# Patient Record
Sex: Male | Born: 1955 | Race: White | Hispanic: No | Smoking: Never smoker
Health system: Southern US, Community
[De-identification: ages and names within clinical notes are randomized; demographics above are authoritative.]

## PROBLEM LIST (undated history)

## (undated) DIAGNOSIS — K219 Gastro-esophageal reflux disease without esophagitis: Secondary | ICD-10-CM

## (undated) DIAGNOSIS — R112 Nausea with vomiting, unspecified: Secondary | ICD-10-CM

## (undated) DIAGNOSIS — M1990 Unspecified osteoarthritis, unspecified site: Secondary | ICD-10-CM

## (undated) DIAGNOSIS — C801 Malignant (primary) neoplasm, unspecified: Secondary | ICD-10-CM

## (undated) HISTORY — DX: Gastro-esophageal reflux disease without esophagitis: K21.9

## (undated) HISTORY — PX: OTHER SURGICAL HISTORY: SHX169

## (undated) HISTORY — PX: NO PAST SURGERIES: SHX2092

---

## 2011-05-05 ENCOUNTER — Ambulatory Visit (INDEPENDENT_AMBULATORY_CARE_PROVIDER_SITE_OTHER): Payer: BC Managed Care – PPO | Admitting: Cardiology

## 2011-05-05 ENCOUNTER — Encounter: Payer: Self-pay | Admitting: Cardiology

## 2011-05-05 VITALS — BP 120/70 | HR 65 | Ht 68.0 in | Wt 161.0 lb

## 2011-05-05 DIAGNOSIS — I493 Ventricular premature depolarization: Secondary | ICD-10-CM

## 2011-05-05 DIAGNOSIS — I4949 Other premature depolarization: Secondary | ICD-10-CM

## 2011-05-05 DIAGNOSIS — Z8249 Family history of ischemic heart disease and other diseases of the circulatory system: Secondary | ICD-10-CM

## 2011-05-05 NOTE — Assessment & Plan Note (Signed)
He's not particularly symptomatic with these. I would like to make sure he has a structurally normal heart and so I will order an echocardiogram. He will have exercise treadmill testing as described. I will defer labs (TSH and BMET/Mg) to his primary provider.

## 2011-05-05 NOTE — Patient Instructions (Signed)
The current medical regimen is effective;  continue present plan and medications.  Your physician has requested that you have an exercise tolerance test. For further information please visit [email or web address]. Please also follow instruction sheet, as given.  Your physician has requested that you have an echocardiogram. Echocardiography is a painless test that uses sound waves to create images of your heart. It provides your doctor with information about the size and shape of your heart and how well your heart's chambers and valves are working. This procedure takes approximately one hour. There are no restrictions for this procedure.   

## 2011-05-05 NOTE — Progress Notes (Signed)
   HPI The patient has no past cardiac history. He was noted to have bradycardia recently. He wasn't noticing this. 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 He does not have any presyncope or syncope. He doesn't notice any palpitations. He has no chest pressure, neck or arm discomfort. He denies any shortness of breath, PND or orthopnea. Of note he doesn't exercise but he does vigorous work at times without limitations.   No Known Allergies  Current Outpatient Prescriptions  Medication Sig Dispense Refill  . RABEprazole (ACIPHEX) 20 MG tablet Take 20 mg by mouth daily.        Past Medical History  Diagnosis Date  . GERD (gastroesophageal reflux disease)     Past Surgical History  Procedure Date  . None     Family History  Problem Relation Age of Onset  . Coronary artery disease Father 93    Died of MI  . Coronary artery disease Paternal Uncle 78    Died of MI (?)    History   Social History  . Marital Status: Widower    Spouse Name: N/A    Number of Children: 0  . Years of Education: N/A   Occupational History  . Truck driver    Social History Main Topics  . Smoking status: Never Smoker   . Smokeless tobacco: Not on file  . Alcohol Use: Not on file  . Drug Use: Not on file  . Sexually Active: Not on file   Other Topics Concern  . Not on file   Social History Narrative   Lives alone. Wife was disabled.    ROS:  As stated in the HPI and negative for all other systems.  PHYSICAL EXAM BP 120/70  Pulse 65  Ht 5\' 8"  (1.727 m)  Wt 161 lb (73.029 kg)  BMI 24.48 kg/m2 GENERAL:  Well appearing HEENT:  Pupils equal round and reactive, fundi not visualized, oral mucosa unremarkable NECK:  No jugular venous distention, waveform within normal limits, carotid upstroke brisk and symmetric, no bruits, no thyromegaly LYMPHATICS:  No cervical, inguinal adenopathy LUNGS:  Clear to auscultation bilaterally BACK:  No CVA  tenderness CHEST:  Unremarkable HEART:  PMI not displaced or sustained,S1 and S2 within normal limits, no S3, no S4, no clicks, no rubs, no murmurs ABD:  Flat, positive bowel sounds normal in frequency in pitch, no bruits, no rebound, no guarding, no midline pulsatile mass, no hepatomegaly, no splenomegaly EXT:  2 plus pulses throughout, no edema, no cyanosis no clubbing SKIN:  No rashes no nodules NEURO:  Cranial nerves II through XII grossly intact, motor grossly intact throughout [facility]:  Cognitively intact, oriented to person place and time  EKG:  Sinus rhythm, rate 65, axis within normal limits, intervals within normal limits, premature ventricular contractions, no acute ST-T wave changes 05/05/2011  ASSESSMENT AND PLAN

## 2011-05-05 NOTE — Assessment & Plan Note (Signed)
I will bring the patient back for a POET (Plain Old Exercise Test). This will allow me to screen for obstructive coronary disease, risk stratify and very importantly provide a prescription for exercise.   

## 2011-05-19 ENCOUNTER — Ambulatory Visit: Payer: Self-pay | Admitting: Cardiology

## 2011-05-20 ENCOUNTER — Ambulatory Visit (HOSPITAL_COMMUNITY): Payer: BC Managed Care – PPO | Attending: Cardiology

## 2011-05-20 ENCOUNTER — Ambulatory Visit (INDEPENDENT_AMBULATORY_CARE_PROVIDER_SITE_OTHER): Payer: BC Managed Care – PPO | Admitting: Nurse Practitioner

## 2011-05-20 ENCOUNTER — Other Ambulatory Visit: Payer: Self-pay

## 2011-05-20 ENCOUNTER — Encounter: Payer: Self-pay | Admitting: Nurse Practitioner

## 2011-05-20 DIAGNOSIS — Z8249 Family history of ischemic heart disease and other diseases of the circulatory system: Secondary | ICD-10-CM

## 2011-05-20 DIAGNOSIS — I4949 Other premature depolarization: Secondary | ICD-10-CM

## 2011-05-20 DIAGNOSIS — I493 Ventricular premature depolarization: Secondary | ICD-10-CM

## 2011-05-20 NOTE — Procedures (Signed)
Exercise Treadmill Test  Pre-Exercise Testing Evaluation Rhythm: sinus bradycardia  Rate: 56   PR:  .18 QRS:  .12  QT:  .40 QTc: .38     Test  Exercise Tolerance Test Ordering MD: Angelina Sheriff, MD  Interpreting MD: Ward Givens NP  Unique Test No: 1  Treadmill:  1  Indication for ETT: Palpitation  Contraindication to ETT: No   Stress Modality: exercise - treadmill  Cardiac Imaging Performed: non   Protocol: standard Bruce - maximal  Max BP:  167/85  Max MPHR (bpm):  162 85% MPR (bpm):  138  MPHR obtained (bpm):  142 % MPHR obtained:  88%  Reached 85% MPHR (min:sec):  8:14 Total Exercise Time (min-sec):  9:00  Workload in METS:  10.1 Borg Scale: 16  Reason ETT Terminated:  desired heart rate attained    ST Segment Analysis At Rest: normal ST segments - no evidence of significant ST depression With Exercise: no evidence of significant ST depression  Other Information Arrhythmia:  rare pvc's - asymptomatic. Angina during ETT:  absent (0) Quality of ETT:  diagnostic  ETT Interpretation:  normal - no evidence of ischemia by ST analysis  Comments: Good exercise tolerance.  No chest pain.  No acute st/t changes  Recommendations: F/u Dr. Antoine Poche

## 2011-05-28 ENCOUNTER — Telehealth: Payer: Self-pay | Admitting: Cardiology

## 2011-05-28 NOTE — Telephone Encounter (Signed)
F/u   Patient returning nurse call concerning test results, he can be reached at [phone].

## 2011-05-28 NOTE — Telephone Encounter (Signed)
Pt aware of echo results. He will call [place] office Monday to schedule appt with Dr. Antoine Poche in 2 months. Mylo Red RN

## 2011-05-28 NOTE — Telephone Encounter (Signed)
Fu msg Pt was calling back about test results

## 2011-05-31 NOTE — Telephone Encounter (Signed)
Will have schedulers to call to schedule pt in 2 months for follow up

## 2011-06-04 ENCOUNTER — Telehealth: Payer: Self-pay | Admitting: Cardiology

## 2011-06-04 NOTE — Telephone Encounter (Signed)
New problem:  Message sent to [facility] Dr. Antoine Poche scheduler from nurse Avie Arenas to contact patient for an appointment with Dr. Antoine Poche in [place].  Evan Baker has call patient several time since 05-31-2011. To make an appointment.

## 2011-06-04 NOTE — Telephone Encounter (Signed)
Pt was instructed to call to schedule an appt in [place]

## 2011-08-18 ENCOUNTER — Ambulatory Visit (INDEPENDENT_AMBULATORY_CARE_PROVIDER_SITE_OTHER): Payer: BC Managed Care – PPO | Admitting: Cardiology

## 2011-08-18 ENCOUNTER — Encounter: Payer: Self-pay | Admitting: Cardiology

## 2011-08-18 VITALS — BP 102/70 | HR 67 | Ht 68.0 in | Wt 161.0 lb

## 2011-08-18 DIAGNOSIS — I4949 Other premature depolarization: Secondary | ICD-10-CM

## 2011-08-18 DIAGNOSIS — I493 Ventricular premature depolarization: Secondary | ICD-10-CM

## 2011-08-18 DIAGNOSIS — Z8249 Family history of ischemic heart disease and other diseases of the circulatory system: Secondary | ICD-10-CM

## 2011-08-18 NOTE — Progress Notes (Signed)
   HPI The patient presents for followup of PVCs and a mildly reduced ejection fraction. He did have an echocardiogram to evaluate the PVCs. This demonstrated his EF was slightly low at 45% with global hypokinesis. There were no significant valvular abnormalities.  He had an exercise tolerance test that did not suggest any ischemia. He had rare PVCs.  Since I last saw him he has done well.  The patient denies any new symptoms such as chest discomfort, neck or arm discomfort. There has been no new shortness of breath, PND or orthopnea. There have been no reported palpitations, presyncope or syncope.  He is very physically active without bringing on any symptoms. He did not notice any of his PVCs.  No Known Allergies  Current Outpatient Prescriptions  Medication Sig Dispense Refill  . RABEprazole (ACIPHEX) 20 MG tablet Take 20 mg by mouth daily.        Past Medical History  Diagnosis Date  . GERD (gastroesophageal reflux disease)     Past Surgical History  Procedure Date  . None     ROS:  As stated in the HPI and negative for all other systems.  PHYSICAL EXAM BP 102/70  Pulse 67  Ht 5\' 8"  (1.727 m)  Wt 161 lb (73.029 kg)  BMI 24.48 kg/m2 GENERAL:  Well appearing NECK:  No jugular venous distention, waveform within normal limits, carotid upstroke brisk and symmetric, no bruits, no thyromegaly LUNGS:  Clear to auscultation bilaterally BACK:  No CVA tenderness CHEST:  Unremarkable HEART:  PMI not displaced or sustained,S1 and S2 within normal limits, no S3, no S4, no clicks, no rubs, no murmurs ABD:  Flat, positive bowel sounds normal in frequency in pitch, no bruits, no rebound, no guarding, no midline pulsatile mass, no hepatomegaly, no splenomegaly EXT:  2 plus pulses throughout, no edema, no cyanosis no clubbing   EKG:  Sinus rhythm, rate 67, possible right atrial enlargement, premature ventricular contractions, no acute ST-T wave changes. 08/18/2011  ASSESSMENT AND  PLAN   PVC (premature ventricular contraction) -   He is having no symptoms related to this. I will check TSH basic metabolic profile and magnesium. However, otherwise no change in therapy or evaluation is indicated.   Abnormal echocardiogram -  Does have a mildly reduced ejection fraction. 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  We discussed this and I plan an echocardiogram in one year. No change in therapy is indicated.  Preop -  The patient informs me that he will be having a back surgery. If this happens within this year I would see no contraindication from a cardiovascular standpoint. He did have a negative exercise treadmill test with no evidence of ischemia and no symptoms.

## 2011-08-18 NOTE — Patient Instructions (Addendum)
The current medical regimen is effective;  continue present plan and medications.  Please have blood work (BMP, TSH and MG) today.  Your physician has requested that you have an echocardiogram in 1 year prior to your follow up a. Echocardiography is a painless test that uses sound waves to create images of your heart. It provides your doctor with information about the size and shape of your heart and how well your heart's chambers and valves are working. This procedure takes approximately one hour. There are no restrictions for this procedure.  Follow up in 1 year with Dr Antoine Poche.  You will receive a letter in the mail 2 months before you are due.  Please call us when you receive this letter to schedule your follow up appointment.

## 2012-01-03 ENCOUNTER — Encounter: Payer: Self-pay | Admitting: Internal Medicine

## 2012-02-10 ENCOUNTER — Encounter: Payer: BC Managed Care – PPO | Admitting: Internal Medicine

## 2012-03-20 ENCOUNTER — Telehealth: Payer: Self-pay | Admitting: Cardiology

## 2012-03-20 NOTE — Telephone Encounter (Signed)
Will forward to MD for review and clearance

## 2012-03-20 NOTE — Telephone Encounter (Signed)
New Problem:     Called in wanting a surgical clearance notice for a patient.  Please call back after 2:30-3:00pm.

## 2012-03-25 NOTE — Telephone Encounter (Signed)
The patient would be at acceptable risk for surgery.  I do not know the name of the surgeon.

## 2012-03-28 NOTE — Telephone Encounter (Signed)
Clearance will be faxed to [phone] per [facility].

## 2012-04-07 ENCOUNTER — Encounter: Payer: Self-pay | Admitting: Nurse Practitioner

## 2012-04-07 ENCOUNTER — Ambulatory Visit (INDEPENDENT_AMBULATORY_CARE_PROVIDER_SITE_OTHER): Payer: BC Managed Care – PPO | Admitting: Nurse Practitioner

## 2012-04-07 VITALS — BP 117/82 | HR 59 | Temp 98.4°F | Ht 68.0 in | Wt 163.0 lb

## 2012-04-07 DIAGNOSIS — K219 Gastro-esophageal reflux disease without esophagitis: Secondary | ICD-10-CM | POA: Insufficient documentation

## 2012-04-07 DIAGNOSIS — Z008 Encounter for other general examination: Secondary | ICD-10-CM

## 2012-04-07 NOTE — Patient Instructions (Signed)

## 2012-04-07 NOTE — Progress Notes (Signed)
  Subjective:    Patient ID: Evan Baker, male    DOB: [birth year], 57 y.o.   MRN: [id number]  HPIPatinet here today for surgical clearance. He is having back surgery April 11,2014 by Dr. Alycia Patten.    Review of Systems  Constitutional: Negative.   HENT: Negative.   Eyes: Negative.   Respiratory: Negative.   Cardiovascular: Negative.   Gastrointestinal: Negative.   Endocrine: Negative.   Genitourinary: Negative.   Musculoskeletal: Positive for back pain (radiating down left leg).  Psychiatric/Behavioral: Negative.        Objective:   Physical Exam  Constitutional: He is oriented to person, place, and time. He appears well-developed and well-nourished.  HENT:  Head: Normocephalic.  Eyes: Pupils are equal, round, and reactive to light.  Neck: Normal range of motion.  Cardiovascular: Normal rate, regular rhythm, normal heart sounds and intact distal pulses.   No murmur heard. Pulmonary/Chest: Effort normal and breath sounds normal. He exhibits no tenderness.  Abdominal: Soft. There is no tenderness.  Musculoskeletal: Normal range of motion.  Neurological: He is alert and oriented to person, place, and time.  Skin: Skin is warm and dry.   BP 117/82  Pulse 59  Temp(Src) 98.4 F (36.9 C) (Oral)  Ht 5\' 8"  (1.727 m)  Wt 163 lb (73.936 kg)  BMI 24.79 kg/m2        Assessment & Plan:  Surgical Clearance  Mary-Margaret Daphine Deutscher, FNP

## 2012-04-13 ENCOUNTER — Telehealth: Payer: Self-pay | Admitting: Cardiology

## 2012-04-13 NOTE — Telephone Encounter (Signed)
Received EKG - given to Dr Antoine Poche for review

## 2012-04-13 NOTE — Telephone Encounter (Signed)
New problem    Pre-op EKG will be faxed over today . Surgery on  4/11. Back surgery   Please compare. PCP does not feel comfortable in addressing  the EKG .

## 2012-04-14 NOTE — Telephone Encounter (Signed)
Left message for Marcelino Duster - per Dr Antoine Poche pt is OK for surgery as cleared.  Requested she call back with any questions.

## 2012-04-14 NOTE — Telephone Encounter (Signed)
Spoke with Marcelino Duster - aware Dr Antoine Poche gave clearance for surgery and it was faxed to their office previously.  She is concerned about the EKG that was faxed yesterday as it demonstrates a "new finding of right BBB"  Marcelino Duster aware I will discuss with MD this afternoon and call her back

## 2012-04-14 NOTE — Telephone Encounter (Signed)
Follow Up    Following up on surgical clearance and EKG that was faxed. Would like to speak to nurse.

## 2012-05-11 HISTORY — PX: SPINE SURGERY: SHX786

## 2012-05-12 ENCOUNTER — Other Ambulatory Visit: Payer: Self-pay

## 2012-05-12 MED ORDER — RABEPRAZOLE SODIUM 20 MG PO TBEC: 20.0000 mg | DELAYED_RELEASE_TABLET | Freq: Every day | ORAL | Status: DC

## 2012-05-12 NOTE — Telephone Encounter (Signed)
Last seen 12/24/11  Print and have nurse call patient to pick up for mail order

## 2012-05-12 NOTE — Telephone Encounter (Signed)
rx ready for pickup 

## 2012-05-13 NOTE — Telephone Encounter (Signed)
Called in.

## 2012-05-15 ENCOUNTER — Telehealth: Payer: Self-pay | Admitting: Nurse Practitioner

## 2012-05-16 ENCOUNTER — Telehealth: Payer: Self-pay | Admitting: *Deleted

## 2012-05-16 NOTE — Telephone Encounter (Signed)
Left message to c/b.

## 2012-05-16 NOTE — Telephone Encounter (Signed)
[facility] called concerned that pts profile listed that he was allergic to PPIs and he had a rx there waiting to be picked up for aciphex. Advised that pt said he had been taking aciphex for several years. Pharmacist said that they would fix profile. Pt notified

## 2012-08-08 ENCOUNTER — Encounter: Payer: Self-pay | Admitting: Family Medicine

## 2012-08-08 ENCOUNTER — Ambulatory Visit (INDEPENDENT_AMBULATORY_CARE_PROVIDER_SITE_OTHER): Payer: BC Managed Care – PPO | Admitting: Family Medicine

## 2012-08-08 VITALS — BP 119/77 | HR 59 | Temp 98.5°F | Ht 68.0 in | Wt 157.0 lb

## 2012-08-08 DIAGNOSIS — K219 Gastro-esophageal reflux disease without esophagitis: Secondary | ICD-10-CM

## 2012-08-08 MED ORDER — RABEPRAZOLE SODIUM 20 MG PO TBEC: 20.0000 mg | DELAYED_RELEASE_TABLET | Freq: Every day | ORAL | Status: DC

## 2012-08-08 NOTE — Progress Notes (Signed)
  Subjective:    Patient ID: Evan Baker, male    DOB: 1955/07/03, 57 y.o.   MRN: [id number]  HPI Pt is here for PPI refill  Has been on aciphex for several years  Asymptomatic with treatment  No hoarseness, cough, epigastric pain  Has tried multiple other PPIs. This has been only effective medication.  Would like refill.  Is not eating any trigger foods like fried goods, tomatoes.    Review of Systems  All other systems reviewed and are negative.       Objective:   Physical Exam  Constitutional: He appears well-developed and well-nourished.  HENT:  Head: Normocephalic and atraumatic.  Eyes: Conjunctivae are normal. Pupils are equal, round, and reactive to light.  Neck: Normal range of motion.  Cardiovascular: Normal rate and regular rhythm.   Pulmonary/Chest: Effort normal.  Abdominal: Soft.  Musculoskeletal: Normal range of motion.  Neurological: He is alert.  Skin: Skin is warm.          Assessment & Plan:  GERD (gastroesophageal reflux disease) - Plan: RABEprazole (ACIPHEX) 20 MG tablet, Comprehensive metabolic panel, Magnesium  Aciphex refilled.  Will also check CMET and mag level given increased risk of electrolyte derangement with long term use.  Continue lifestyle modification.  Follow up as needed.

## 2012-08-09 LAB — MAGNESIUM: Magnesium: 1.9 mg/dL (ref 1.6–2.6)

## 2012-08-09 LAB — COMPREHENSIVE METABOLIC PANEL
ALT: 8 IU/L (ref 0–44)
Albumin/Globulin Ratio: 2.1 (ref 1.1–2.5)
Albumin: 4.2 g/dL (ref 3.5–5.5)
BUN: 10 mg/dL (ref 6–24)
Calcium: 9.5 mg/dL (ref 8.7–10.2)
GFR calc Af Amer: 88 mL/min/{1.73_m2} (ref >59)
GFR calc non Af Amer: 76 mL/min/{1.73_m2} (ref >59)
Glucose: 74 mg/dL (ref 65–99)
Potassium: 4 mmol/L (ref 3.5–5.2)
Total Bilirubin: 0.6 mg/dL (ref 0.0–1.2)
Total Protein: 6.2 g/dL (ref 6.0–8.5)

## 2012-10-19 ENCOUNTER — Other Ambulatory Visit: Payer: Self-pay | Admitting: Family Medicine

## 2012-10-20 NOTE — Telephone Encounter (Signed)
Patient last seen in office on 08-08-12 by [facility]. Pharmacy reqesting but do not see in chart on med list. Please advise. Also it is for mail order.

## 2012-10-31 ENCOUNTER — Ambulatory Visit (INDEPENDENT_AMBULATORY_CARE_PROVIDER_SITE_OTHER): Payer: BC Managed Care – PPO | Admitting: General Practice

## 2012-10-31 ENCOUNTER — Encounter (INDEPENDENT_AMBULATORY_CARE_PROVIDER_SITE_OTHER): Payer: Self-pay

## 2012-10-31 ENCOUNTER — Encounter: Payer: Self-pay | Admitting: General Practice

## 2012-10-31 ENCOUNTER — Telehealth: Payer: Self-pay | Admitting: Nurse Practitioner

## 2012-10-31 VITALS — BP 122/81 | HR 63 | Temp 97.1°F | Ht 68.0 in | Wt 157.0 lb

## 2012-10-31 DIAGNOSIS — F32A Depression, unspecified: Secondary | ICD-10-CM

## 2012-10-31 DIAGNOSIS — F329 Major depressive disorder, single episode, unspecified: Secondary | ICD-10-CM

## 2012-10-31 MED ORDER — PAROXETINE HCL 20 MG PO TABS: 20.0000 mg | ORAL_TABLET | ORAL | Status: DC

## 2012-10-31 NOTE — Progress Notes (Signed)
  Subjective:    Patient ID: Evan Baker, male    DOB: 1955/10/09, 57 y.o.   MRN: [id number]  HPI Patient presents today with complaints of mood swings, unable to complete task and lack of motivation. Reports onset as one year ago and gradually worsening. Reports onset He reports a history of depression and feeling this way in the past. Reports taking paxil six years ago and found it to be very effective. Reports weaning himself off the paxil. Denies having side effects from paxil in the past. Denies thoughts of harming self or others.     Review of Systems  Constitutional: Negative for fever and chills.  Respiratory: Negative for chest tightness and shortness of breath.   Cardiovascular: Negative for chest pain and palpitations.  Gastrointestinal: Negative for nausea, vomiting, abdominal pain, diarrhea, constipation and blood in stool.  Genitourinary: Negative for difficulty urinating.  Neurological: Negative for dizziness, weakness and headaches.  Psychiatric/Behavioral: Negative for suicidal ideas and sleep disturbance. The patient is not nervous/anxious.        Objective:   Physical Exam  Constitutional: He is oriented to person, place, and time. He appears well-developed and well-nourished.  HENT:  Head: Normocephalic and atraumatic.  Right Ear: External ear normal.  Left Ear: External ear normal.  Nose: Nose normal.  Mouth/Throat: Oropharynx is clear and moist.  Eyes: Conjunctivae and EOM are normal. Pupils are equal, round, and reactive to light.  Neck: Normal range of motion. Neck supple. No thyromegaly present.  Cardiovascular: Normal rate, regular rhythm and normal heart sounds.   Pulmonary/Chest: Effort normal and breath sounds normal. No respiratory distress. He exhibits no tenderness.  Abdominal: Soft. Bowel sounds are normal. He exhibits no distension. There is no tenderness.  Musculoskeletal: Normal range of motion.  Lymphadenopathy:    He has no cervical  adenopathy.  Neurological: He is alert and oriented to person, place, and time.  Skin: Skin is warm and dry.  Psychiatric: He has a normal mood and affect.          Assessment & Plan:  1. Depression  - PARoxetine (PAXIL) 20 MG tablet; Take 1 tablet (20 mg total) by mouth every morning.  Dispense: 30 tablet; Refill: 0 -discussed and provided information on depression -RTO for follow up in 1 week -Patient verbalized understanding Coralie Keens, FNP-C

## 2012-10-31 NOTE — Telephone Encounter (Signed)
Pt needs appt to restart Paxil appt scheduled

## 2012-10-31 NOTE — Patient Instructions (Signed)

## 2012-11-06 ENCOUNTER — Encounter: Payer: Self-pay | Admitting: General Practice

## 2012-11-06 ENCOUNTER — Ambulatory Visit (INDEPENDENT_AMBULATORY_CARE_PROVIDER_SITE_OTHER): Payer: BC Managed Care – PPO | Admitting: General Practice

## 2012-11-06 VITALS — BP 118/77 | HR 53 | Temp 96.7°F | Ht 68.0 in | Wt 155.0 lb

## 2012-11-06 DIAGNOSIS — F329 Major depressive disorder, single episode, unspecified: Secondary | ICD-10-CM

## 2012-11-06 DIAGNOSIS — F32A Depression, unspecified: Secondary | ICD-10-CM

## 2012-11-06 MED ORDER — PAROXETINE HCL 20 MG PO TABS: 20.0000 mg | ORAL_TABLET | ORAL | Status: DC

## 2012-11-06 NOTE — Progress Notes (Signed)
  Subjective:    Patient ID: Evan Baker, male    DOB: Jun 29, 1955, 57 y.o.   MRN: [id number]  HPI Patient presents today for follow up of depression. He was started on paxil at last visit and reports moods swings, motivation, and completing task have improved. Reports feeling the medication and dosage is very effective. Reports taking medications as directed. Denies side effects. Denies thoughts of harming self or others.     Review of Systems  Constitutional: Negative for fever and chills.  Respiratory: Negative for chest tightness and shortness of breath.   Cardiovascular: Negative for chest pain and palpitations.  Gastrointestinal: Negative for nausea, vomiting, abdominal pain, diarrhea, constipation and blood in stool.  Genitourinary: Negative for difficulty urinating.  Neurological: Negative for dizziness, weakness and headaches.  Psychiatric/Behavioral: Negative for suicidal ideas and sleep disturbance. The patient is not nervous/anxious.        Objective:   Physical Exam  Constitutional: He is oriented to person, place, and time. He appears well-developed and well-nourished.  HENT:  Head: Normocephalic and atraumatic.  Right Ear: External ear normal.  Left Ear: External ear normal.  Nose: Nose normal.  Mouth/Throat: Oropharynx is clear and moist.  Eyes: Conjunctivae and EOM are normal. Pupils are equal, round, and reactive to light.  Neck: Normal range of motion. Neck supple. No thyromegaly present.  Cardiovascular: Normal rate, regular rhythm and normal heart sounds.   Pulmonary/Chest: Effort normal and breath sounds normal. No respiratory distress. He exhibits no tenderness.  Abdominal: Soft. Bowel sounds are normal. He exhibits no distension. There is no tenderness.  Musculoskeletal: Normal range of motion.  Lymphadenopathy:    He has no cervical adenopathy.  Neurological: He is alert and oriented to person, place, and time.  Skin: Skin is warm and dry.   Psychiatric: He has a normal mood and affect.          Assessment & Plan:  1. Depression - PARoxetine (PAXIL) 20 MG tablet; Take 1 tablet (20 mg total) by mouth every morning.  Dispense: 30 tablet; Refill: 3 -RTO in 3 months and prn -Patient verbalized understanding -Coralie Keens, FNP-C

## 2012-12-06 ENCOUNTER — Ambulatory Visit (INDEPENDENT_AMBULATORY_CARE_PROVIDER_SITE_OTHER): Payer: BC Managed Care – PPO | Admitting: General Practice

## 2012-12-06 ENCOUNTER — Encounter: Payer: Self-pay | Admitting: General Practice

## 2012-12-06 VITALS — BP 114/69 | HR 63 | Temp 96.5°F | Ht 68.0 in | Wt 157.0 lb

## 2012-12-06 DIAGNOSIS — F32A Depression, unspecified: Secondary | ICD-10-CM

## 2012-12-06 DIAGNOSIS — F329 Major depressive disorder, single episode, unspecified: Secondary | ICD-10-CM

## 2012-12-06 MED ORDER — PAROXETINE HCL 20 MG PO TABS: 20.0000 mg | ORAL_TABLET | ORAL | Status: DC

## 2012-12-06 NOTE — Progress Notes (Signed)
  Subjective:    Patient ID: Evan Baker, male    DOB: 1955/09/23, 57 y.o.   MRN: [id number]  HPI Patient presents today for follow up of depression. He reports medication is effective in controlling mood swings, increasing motivation, and able to complete task. Reports taking medications as directed. Denies side effects. Denies thoughts of harming self or others.     Review of Systems  Constitutional: Negative for fever and chills.  Respiratory: Negative for chest tightness and shortness of breath.   Cardiovascular: Negative for chest pain and palpitations.  Gastrointestinal: Negative for nausea, vomiting, abdominal pain, diarrhea, constipation and blood in stool.  Genitourinary: Negative for difficulty urinating.  Neurological: Negative for dizziness, weakness and headaches.  Psychiatric/Behavioral: Negative for suicidal ideas and sleep disturbance. The patient is not nervous/anxious.        Objective:   Physical Exam  Constitutional: He is oriented to person, place, and time. He appears well-developed and well-nourished.  HENT:  Head: Normocephalic and atraumatic.  Right Ear: External ear normal.  Left Ear: External ear normal.  Nose: Nose normal.  Mouth/Throat: Oropharynx is clear and moist.  Eyes: Conjunctivae and EOM are normal. Pupils are equal, round, and reactive to light.  Neck: Normal range of motion. Neck supple. No thyromegaly present.  Cardiovascular: Normal rate, regular rhythm and normal heart sounds.   Pulmonary/Chest: Effort normal and breath sounds normal. No respiratory distress. He exhibits no tenderness.  Abdominal: Soft. Bowel sounds are normal. He exhibits no distension. There is no tenderness.  Musculoskeletal: Normal range of motion.  Lymphadenopathy:    He has no cervical adenopathy.  Neurological: He is alert and oriented to person, place, and time.  Skin: Skin is warm and dry.  Psychiatric: He has a normal mood and affect.           Assessment & Plan:  1. Depression  - PARoxetine (PAXIL) 20 MG tablet; Take 1 tablet (20 mg total) by mouth every morning.  Dispense: 30 tablet; Refill: 6 -continue current medication -RTO in 6 months for follow up -Patient verbalized understanding Coralie Keens, FNP-C

## 2012-12-06 NOTE — Patient Instructions (Signed)
Exercise to Stay Healthy Exercise helps you become and stay healthy. EXERCISE IDEAS AND TIPS Choose exercises that:  You enjoy.  Fit into your day. You do not need to exercise really hard to be healthy. You can do exercises at a slow or medium level and stay healthy. You can:  Stretch before and after working out.  Try yoga, Pilates, or tai chi.  Lift weights.  Walk fast, swim, jog, run, climb stairs, bicycle, dance, or rollerskate.  Take aerobic classes. Exercises that burn about 150 calories:  Running 1  miles in 15 minutes.  Playing volleyball for 45 to 60 minutes.  Washing and waxing a car for 45 to 60 minutes.  Playing touch football for 45 minutes.  Walking 1  miles in 35 minutes.  Pushing a stroller 1  miles in 30 minutes.  Playing basketball for 30 minutes.  Raking leaves for 30 minutes.  Bicycling 5 miles in 30 minutes.  Walking 2 miles in 30 minutes.  Dancing for 30 minutes.  Shoveling snow for 15 minutes.  Swimming laps for 20 minutes.  Walking up stairs for 15 minutes.  Bicycling 4 miles in 15 minutes.  Gardening for 30 to 45 minutes.  Jumping rope for 15 minutes.  Washing windows or floors for 45 to 60 minutes. Document Released: 01/30/2010 Document Revised: 03/22/2011 Document Reviewed: 01/30/2010 [employer] Patient Information 2014 [employer].  

## 2013-01-02 ENCOUNTER — Ambulatory Visit (INDEPENDENT_AMBULATORY_CARE_PROVIDER_SITE_OTHER): Payer: BC Managed Care – PPO | Admitting: Family Medicine

## 2013-01-02 ENCOUNTER — Encounter: Payer: Self-pay | Admitting: Family Medicine

## 2013-01-02 ENCOUNTER — Telehealth: Payer: Self-pay | Admitting: General Practice

## 2013-01-02 VITALS — BP 111/74 | HR 58 | Temp 97.1°F | Ht 68.0 in | Wt 155.8 lb

## 2013-01-02 DIAGNOSIS — Z8249 Family history of ischemic heart disease and other diseases of the circulatory system: Secondary | ICD-10-CM

## 2013-01-02 DIAGNOSIS — R52 Pain, unspecified: Secondary | ICD-10-CM | POA: Insufficient documentation

## 2013-01-02 DIAGNOSIS — I4949 Other premature depolarization: Secondary | ICD-10-CM

## 2013-01-02 DIAGNOSIS — I493 Ventricular premature depolarization: Secondary | ICD-10-CM

## 2013-01-02 DIAGNOSIS — R350 Frequency of micturition: Secondary | ICD-10-CM | POA: Insufficient documentation

## 2013-01-02 DIAGNOSIS — K219 Gastro-esophageal reflux disease without esophagitis: Secondary | ICD-10-CM

## 2013-01-02 DIAGNOSIS — R42 Dizziness and giddiness: Secondary | ICD-10-CM | POA: Insufficient documentation

## 2013-01-02 LAB — POCT CBC
Granulocyte percent: 66.4 %G (ref 37–80)
HCT, POC: 49.4 % (ref 43.5–53.7)
Hemoglobin: 15.8 g/dL (ref 14.1–18.1)
Lymph, poc: 2.1 (ref 0.6–3.4)
MCH, POC: 28.2 pg (ref 27–31.2)
MCHC: 32 g/dL (ref 31.8–35.4)
MCV: 88.3 fL (ref 80–97)
MPV: 8.3 fL (ref 0–99.8)
POC Granulocyte: 5.2 (ref 2–6.9)
POC LYMPH PERCENT: 36.7 %L (ref 10–50)
Platelet Count, POC: 279 10*3/uL (ref 142–424)
RBC: 5.6 M/uL (ref 4.69–6.13)
RDW, POC: 13.4 %
WBC: 7.9 10*3/uL (ref 4.6–10.2)

## 2013-01-02 LAB — POCT URINALYSIS DIPSTICK
Bilirubin, UA: NEGATIVE
Blood, UA: NEGATIVE
Glucose, UA: NEGATIVE
Ketones, UA: NEGATIVE
Leukocytes, UA: NEGATIVE
Nitrite, UA: NEGATIVE
Protein, UA: NEGATIVE
Spec Grav, UA: 1.02
Urobilinogen, UA: NEGATIVE
pH, UA: 6

## 2013-01-02 LAB — POCT UA - MICROSCOPIC ONLY
Bacteria, U Microscopic: NEGATIVE
Casts, Ur, LPF, POC: NEGATIVE
Crystals, Ur, HPF, POC: NEGATIVE
Mucus, UA: NEGATIVE
RBC, urine, microscopic: NEGATIVE
WBC, Ur, HPF, POC: NEGATIVE
Yeast, UA: NEGATIVE

## 2013-01-02 LAB — POCT INFLUENZA A/B
Influenza A, POC: NEGATIVE
Influenza B, POC: NEGATIVE

## 2013-01-02 NOTE — Progress Notes (Signed)
Patient ID: Evan Baker, male   DOB: 21-Nov-1955, 57 y.o.   MRN: [id number] SUBJECTIVE: CC: Chief Complaint  Patient presents with  . Acute Visit    achy and nausesous sudden onset this am states frequent urination since last summer    HPI:  as above. Feels a little sore all over , no fever, no cough , no SOB, no chest pain. Past Medical History  Diagnosis Date  . GERD (gastroesophageal reflux disease)    Past Surgical History  Procedure Laterality Date  . None    . Spine surgery  05/2012   History   Social History  . Marital Status: Married    Spouse Name: N/A    Number of Children: 0  . Years of Education: N/A   Occupational History  . Truck driver    Social History Main Topics  . Smoking status: Never Smoker   . Smokeless tobacco: Never Used  . Alcohol Use: No  . Drug Use: No  . Sexual Activity: Not on file   Other Topics Concern  . Not on file   Social History Narrative   Lives alone. Wife was disabled.   Family History  Problem Relation Age of Onset  . Coronary artery disease Father 5    Died of MI  . Coronary artery disease Paternal Uncle 11    Died of MI (?)   Current Outpatient Prescriptions on File Prior to Visit  Medication Sig Dispense Refill  . CIALIS 20 MG tablet TAKE ONE-HALF (1/2) TO 1 TABLET EVERY TWO TO THREE DAYS OR AS NEEDED  18 tablet  2  . PARoxetine (PAXIL) 20 MG tablet Take 1 tablet (20 mg total) by mouth every morning.  30 tablet  6  . RABEprazole (ACIPHEX) 20 MG tablet Take 1 tablet (20 mg total) by mouth daily.  90 tablet  3   No current facility-administered medications on file prior to visit.   No Known Allergies  There is no immunization history on file for this patient. Prior to Admission medications   Medication Sig Start Date End Date Taking? Authorizing Provider  CIALIS 20 MG tablet TAKE ONE-HALF (1/2) TO 1 TABLET EVERY TWO TO THREE DAYS OR AS NEEDED 10/19/12  Yes Evan Daphine Deutscher, FNP  PARoxetine (PAXIL) 20 MG  tablet Take 1 tablet (20 mg total) by mouth every morning. 12/06/12  Yes Evan Shelda Altes, FNP  RABEprazole (ACIPHEX) 20 MG tablet Take 1 tablet (20 mg total) by mouth daily. 08/08/12  Yes Evan Albee, MD     ROS: As above in the HPI. All other systems are stable or negative.  OBJECTIVE: APPEARANCE:  Patient in no acute distress.The patient appeared well nourished and normally developed. Acyanotic. Waist: VITAL SIGNS:BP 111/74  Pulse 58  Temp(Src) 97.1 F (36.2 C) (Oral)  Ht 5\' 8"  (1.727 m)  Wt 155 lb 12.8 oz (70.67 kg)  BMI 23.69 kg/m2  WM  SKIN: warm and  Dry without overt rashes, tattoos and scars  HEAD and Neck: without JVD, Head and scalp: normal Eyes:No scleral icterus. Fundi normal, eye movements normal. Ears: Auricle normal, canal normal, Tympanic membranes normal, insufflation normal. Nose: normal Throat: normal Neck & thyroid: normal  CHEST & LUNGS: Chest wall: normal Lungs: Clear  CVS: Reveals the PMI to be normally located. Regular rhythm, First and Second Heart sounds are normal,  absence of murmurs, rubs or gallops. Peripheral vasculature: Radial pulses: normal Dorsal pedis pulses: normal Posterior pulses: normal  ABDOMEN:  Appearance: normal Benign,  no organomegaly, no masses, no Abdominal Aortic enlargement. No Guarding , no rebound. No Bruits. Bowel sounds: normal  RECTAL: N/A GU: N/A  EXTREMETIES: nonedematous.  MUSCULOSKELETAL:  Spine: normal Joints: intact  NEUROLOGIC: oriented to time,place and person; nonfocal. Strength is normal Sensory is normal Reflexes are normal Cranial Nerves are normal.  ASSESSMENT: Body aches - Plan: Influenza A/B  PVC (premature ventricular contraction)  GERD (gastroesophageal reflux disease)  Family history of acute myocardial infarction  Dizziness and giddiness - Plan: POCT CBC  Frequency of urination - Plan: POCT UA - Microscopic Only, POCT urinalysis dipstick  PLAN:  Orders Placed  This Encounter  Procedures  . Influenza A/B  . POCT CBC  . POCT UA - Microscopic Only  . POCT urinalysis dipstick   Results for orders placed in visit on 01/02/13  POCT INFLUENZA A/B      Result Value Range   Influenza A, POC Negative     Influenza B, POC Negative    POCT CBC      Result Value Range   WBC 7.9  4.6 - 10.2 K/uL   Lymph, poc 2.1  0.6 - 3.4   POC LYMPH PERCENT 36.7  10 - 50 %L   MID (cbc)    0 - 0.9   POC MID %    0 - 12 %M   POC Granulocyte 5.2  2 - 6.9   Granulocyte percent 66.4  37 - 80 %G   RBC 5.6  4.69 - 6.13 M/uL   Hemoglobin 15.8  14.1 - 18.1 g/dL   HCT, POC 08.6  57.8 - 53.7 %   MCV 88.3  80 - 97 fL   MCH, POC 28.2  27 - 31.2 pg   MCHC 32.0  31.8 - 35.4 g/dL   RDW, POC 46.9     Platelet Count, POC 279.0  142 - 424 K/uL   MPV 8.3  0 - 99.8 fL  POCT UA - MICROSCOPIC ONLY      Result Value Range   WBC, Ur, HPF, POC neg     RBC, urine, microscopic neg     Bacteria, U Microscopic neg     Mucus, UA neg     Epithelial cells, urine per micros occ     Crystals, Ur, HPF, POC neg     Casts, Ur, LPF, POC neg     Yeast, UA neg    POCT URINALYSIS DIPSTICK      Result Value Range   Color, UA gold     Clarity, UA clear     Glucose, UA neg     Bilirubin, UA neg     Ketones, UA neg     Spec Grav, UA 1.020     Blood, UA neg     pH, UA 6.0     Protein, UA neg     Urobilinogen, UA negative     Nitrite, UA neg     Leukocytes, UA Negative     Most likely a viral illness.  Observe for now No orders of the defined types were placed in this encounter.   There are no discontinued medications. Return if symptoms worsen or fail to improve.  Evan Baker, M.D.

## 2013-01-02 NOTE — Telephone Encounter (Signed)
Pt having flu like sxs appt scheduled

## 2013-04-23 ENCOUNTER — Encounter: Payer: Self-pay | Admitting: *Deleted

## 2013-05-10 ENCOUNTER — Encounter: Payer: Self-pay | Admitting: *Deleted

## 2013-06-07 ENCOUNTER — Ambulatory Visit: Payer: BC Managed Care – PPO | Admitting: Nurse Practitioner

## 2013-06-08 ENCOUNTER — Ambulatory Visit: Payer: BC Managed Care – PPO | Admitting: General Practice

## 2013-06-08 ENCOUNTER — Ambulatory Visit: Payer: BC Managed Care – PPO | Admitting: Nurse Practitioner

## 2013-07-10 ENCOUNTER — Encounter: Payer: Self-pay | Admitting: Nurse Practitioner

## 2013-07-10 ENCOUNTER — Ambulatory Visit (INDEPENDENT_AMBULATORY_CARE_PROVIDER_SITE_OTHER): Payer: BC Managed Care – PPO | Admitting: Nurse Practitioner

## 2013-07-10 VITALS — BP 120/84 | HR 62 | Temp 97.6°F | Ht 68.0 in | Wt 160.0 lb

## 2013-07-10 DIAGNOSIS — R972 Elevated prostate specific antigen [PSA]: Secondary | ICD-10-CM

## 2013-07-10 DIAGNOSIS — K219 Gastro-esophageal reflux disease without esophagitis: Secondary | ICD-10-CM

## 2013-07-10 DIAGNOSIS — Z0000 Encounter for general adult medical examination without abnormal findings: Secondary | ICD-10-CM

## 2013-07-10 MED ORDER — RABEPRAZOLE SODIUM 20 MG PO TBEC: 20.0000 mg | DELAYED_RELEASE_TABLET | Freq: Every day | ORAL | Status: DC

## 2013-07-10 NOTE — Patient Instructions (Signed)
Prostate-Specific Antigen The prostate-specific antigen (PSA) test is a blood test. It can be used to help diagnose prostate cancer in men whose physical exam results suggest the presence of prostate cancer. Some factors interfere with the results of the PSA. The factors listed below will either increase or decrease the PSA levels. They are:  Prescriptions used for male baldness.  Some herbs.  Active prostate infection.  Prior instrumentation or urinary catheterization.  Ejaculation up to 2 days prior to testing.  A noncancerous enlargement of the prostate.  Inflammation of the prostate.  Active urinary tract infection. If your test results are high (elevated), your health care provider will discuss the results with you. He or she will also let you know if more evaluation is needed. PREPARATION FOR TEST No preparation or fasting is necessary. NORMAL FINDINGS Less than 4 ng/mL or less than 2mcg/L (SI units) Ranges for normal findings may vary among different labs and hospitals. You should always check with your health care provider after having lab work or other tests done to discuss the meaning of your test results and whether your values are considered within normal limits. MEANING OF TEST  A normal value means prostate cancer is less likely. The chance of having prostate cancer increases if the value is between 4 ng/mL and 10 ng/mL. However, further testing will be needed. Values above 10 ng/mL suggest that there is a much higher chance of having prostate cancer (if the above situations that raise PSA are not present). Your health care provider will go over your test results with you and discuss the importance of this test. If this value is elevated, your health care provider may recommend further testing or evaluation. OBTAINING THE TEST RESULTS It is your responsibility to obtain your test results. Ask the lab or department performing the test when and how you will get your  results. Document Released: 01/31/2004 Document Revised: 01/02/2013 Document Reviewed: 08/05/2006 [facility] Patient Information 2015 [place], [place]. This information is not intended to replace advice given to you by your health care provider. Make sure you discuss any questions you have with your health care provider.

## 2013-07-10 NOTE — Progress Notes (Signed)
   Subjective:    Patient ID: Evan Baker, male    DOB: 09-07-1955, 58 y.o.   MRN: [id number]   Patient is  Here today for his annual physical. No acute complaints. See an optometrist yearly, wears prescription glasses.   Gastrophageal Reflux He complains of belching and heartburn. He reports no coughing. This is a chronic problem. The current episode started more than 1 year ago. The problem occurs occasionally. The problem has been gradually improving. The heartburn duration is less than a minute. The heartburn is of mild intensity. The heartburn does not wake him from sleep. The heartburn does not limit his activity. The heartburn doesn't change with position. The symptoms are aggravated by certain foods. There are no known risk factors. He has tried a PPI for the symptoms.    * patient brought lab work results from work for [place] to look at and they are dated 10/18/12- His PSA had gone up from 1.1 the previous year to 5 on that report- No one said anything to him and he has not seen urologist.   Review of Systems  Constitutional: Negative.   HENT: Negative.   Eyes: Negative.   Respiratory: Negative for cough.   Cardiovascular: Negative.   Gastrointestinal: Positive for heartburn.  Endocrine: Negative.   Genitourinary: Negative.   Musculoskeletal: Negative.   Skin: Negative.   Allergic/Immunologic: Negative.   Neurological: Negative.   Hematological: Negative.   Psychiatric/Behavioral: Negative.        Objective:   Physical Exam  Constitutional: He is oriented to person, place, and time. He appears well-developed and well-nourished.  HENT:  Head: Normocephalic.  Eyes: Pupils are equal, round, and reactive to light.  Neck: Normal range of motion.  Cardiovascular: Normal rate.   Pulmonary/Chest: Effort normal.  Abdominal: Soft.  Neurological: He is alert and oriented to person, place, and time.  Skin: Skin is warm.  Psychiatric: He has a normal mood and affect.   BP  120/84  Pulse 62  Temp(Src) 97.6 F (36.4 C) (Oral)  Ht $R'5\' 8"'lR$  (1.727 m)  Wt 160 lb (72.576 kg)  BMI 24.33 kg/m2        Assessment & Plan:  1. Annual physical exam Low fat diet and exercise Health  maintenance reviewed - NMR, lipoprofile - CMP14+EGFR  2. Gastroesophageal reflux disease, esophagitis presence not specified Avoid spicy and fatty foods Do  Not eat 2 hours prior to bedtime Meds ordered this encounter  Medications  . RABEprazole (ACIPHEX) 20 MG tablet    Sig: Take 1 tablet (20 mg total) by mouth daily.    Dispense:  90 tablet    Refill:  1    Order Specific Question:  Supervising Provider    Answer:  Chipper Herb [[id number]]    3. Elevated PSA measurement discussed significance of increase - PSA, total and free - Ambulatory referral to Urology   Mary-Margaret Hassell Done, FNP

## 2013-07-11 LAB — CMP14+EGFR
ALT: 12 IU/L (ref 0–44)
AST: 22 IU/L (ref 0–40)
Albumin/Globulin Ratio: 2.4 (ref 1.1–2.5)
Albumin: 4.6 g/dL (ref 3.6–4.8)
Alkaline Phosphatase: 75 IU/L (ref 39–117)
BUN/Creatinine Ratio: 8 — ABNORMAL LOW (ref 10–22)
BUN: 9 mg/dL (ref 8–27)
CALCIUM: 9.5 mg/dL (ref 8.6–10.2)
CO2: 26 mmol/L (ref 18–29)
CREATININE: 1.15 mg/dL (ref 0.76–1.27)
Chloride: 100 mmol/L (ref 97–108)
GFR calc Af Amer: 80 mL/min/{1.73_m2} (ref >59)
GFR, EST NON AFRICAN AMERICAN: 69 mL/min/{1.73_m2} (ref >59)
GLOBULIN, TOTAL: 1.9 g/dL (ref 1.5–4.5)
GLUCOSE: 86 mg/dL (ref 65–99)
Potassium: 4.5 mmol/L (ref 3.5–5.2)
SODIUM: 141 mmol/L (ref 134–144)
TOTAL PROTEIN: 6.5 g/dL (ref 6.0–8.5)
Total Bilirubin: 1 mg/dL (ref 0.0–1.2)

## 2013-07-11 LAB — NMR, LIPOPROFILE
CHOLESTEROL: 152 mg/dL (ref 100–199)
HDL Cholesterol by NMR: 45 mg/dL (ref >39)
HDL Particle Number: 31.4 umol/L (ref >=30.5)
LDL Particle Number: 1114 nmol/L — ABNORMAL HIGH (ref <1000)
LDL Size: 21.2 nm (ref >20.5)
LDLC SERPL CALC-MCNC: 96 mg/dL (ref 0–99)
LP-IR SCORE: 29 (ref <=45)
Small LDL Particle Number: 463 nmol/L (ref <=527)
Triglycerides by NMR: 57 mg/dL (ref 0–149)

## 2013-07-11 LAB — PSA, TOTAL AND FREE
PSA FREE PCT: 17.7 %
PSA, Free: 0.46 ng/mL
PSA: 2.6 ng/mL (ref 0.0–4.0)

## 2013-07-12 ENCOUNTER — Telehealth: Payer: Self-pay | Admitting: Family Medicine

## 2013-07-12 NOTE — Telephone Encounter (Signed)
Message copied by Waverly Ferrari on Thu Jul 12, 2013  3:30 PM ------      Message from: Chevis Pretty      Created: Wed Jul 11, 2013 12:44 PM       Kidney and liver function stable      Cholesterol looks good      psa normal      Continue current meds- low fat diet and exercise and recheck in 3 months       ------

## 2013-07-16 NOTE — Telephone Encounter (Signed)
Yes needs to keep appointment with urologist- PSA increased to much in one year. Needs to sho urologist lab results from work as well.

## 2013-07-17 NOTE — Telephone Encounter (Signed)
Patient aware, still has not heard from referrals please call

## 2013-07-31 ENCOUNTER — Telehealth: Payer: Self-pay | Admitting: Nurse Practitioner

## 2013-07-31 DIAGNOSIS — K219 Gastro-esophageal reflux disease without esophagitis: Secondary | ICD-10-CM

## 2013-07-31 MED ORDER — RABEPRAZOLE SODIUM 20 MG PO TBEC: 20.0000 mg | DELAYED_RELEASE_TABLET | Freq: Every day | ORAL | Status: DC

## 2013-07-31 NOTE — Telephone Encounter (Signed)
done

## 2013-08-01 ENCOUNTER — Other Ambulatory Visit: Payer: Self-pay | Admitting: Family Medicine

## 2013-08-14 ENCOUNTER — Ambulatory Visit (INDEPENDENT_AMBULATORY_CARE_PROVIDER_SITE_OTHER): Payer: BC Managed Care – PPO | Admitting: Urology

## 2013-08-14 DIAGNOSIS — R972 Elevated prostate specific antigen [PSA]: Secondary | ICD-10-CM

## 2013-08-14 DIAGNOSIS — N320 Bladder-neck obstruction: Secondary | ICD-10-CM

## 2013-08-14 DIAGNOSIS — N529 Male erectile dysfunction, unspecified: Secondary | ICD-10-CM

## 2013-10-16 ENCOUNTER — Ambulatory Visit (INDEPENDENT_AMBULATORY_CARE_PROVIDER_SITE_OTHER): Payer: BC Managed Care – PPO | Admitting: Urology

## 2013-10-16 ENCOUNTER — Telehealth: Payer: Self-pay | Admitting: Nurse Practitioner

## 2013-10-16 DIAGNOSIS — N320 Bladder-neck obstruction: Secondary | ICD-10-CM

## 2013-10-16 DIAGNOSIS — N529 Male erectile dysfunction, unspecified: Secondary | ICD-10-CM

## 2013-10-16 DIAGNOSIS — R350 Frequency of micturition: Secondary | ICD-10-CM

## 2013-10-16 MED ORDER — CIALIS 20 MG PO TABS: ORAL_TABLET | ORAL | Status: DC

## 2013-10-16 NOTE — Telephone Encounter (Signed)
I sent the Rx to the pharmacy.

## 2014-01-05 ENCOUNTER — Other Ambulatory Visit: Payer: Self-pay | Admitting: Family Medicine

## 2014-01-31 ENCOUNTER — Telehealth: Payer: Self-pay | Admitting: Nurse Practitioner

## 2014-01-31 NOTE — Telephone Encounter (Signed)
Patient aware that he would have to be seen since he has not been seen since 06/2013. Patient understands and appointment scheduled.

## 2014-02-18 ENCOUNTER — Encounter: Payer: Self-pay | Admitting: Nurse Practitioner

## 2014-02-18 ENCOUNTER — Ambulatory Visit (INDEPENDENT_AMBULATORY_CARE_PROVIDER_SITE_OTHER): Payer: BLUE CROSS/BLUE SHIELD | Admitting: Nurse Practitioner

## 2014-02-18 ENCOUNTER — Telehealth: Payer: Self-pay | Admitting: Nurse Practitioner

## 2014-02-18 VITALS — BP 131/82 | HR 69 | Temp 96.9°F | Ht 68.0 in | Wt 164.0 lb

## 2014-02-18 DIAGNOSIS — M549 Dorsalgia, unspecified: Secondary | ICD-10-CM

## 2014-02-18 DIAGNOSIS — G8929 Other chronic pain: Secondary | ICD-10-CM

## 2014-02-18 MED ORDER — DULOXETINE HCL 60 MG PO CPEP: 60.0000 mg | ORAL_CAPSULE | Freq: Every day | ORAL | Status: DC

## 2014-02-18 MED ORDER — DULOXETINE HCL 30 MG PO CPEP: 30.0000 mg | ORAL_CAPSULE | Freq: Every day | ORAL | Status: DC

## 2014-02-18 NOTE — Patient Instructions (Signed)

## 2014-02-18 NOTE — Progress Notes (Signed)
   Subjective:    Patient ID: Evan Baker, male    DOB: [birth year], 59 y.o.   MRN: [id number]  HPI Patient is here today wanting to discuss the possibility of starting cymbalta due to pain. He reports history of lower back pain and hip due to surgery. He reports the pain radiate down to his legs mainly the left and has a burning sensation on his left side . He reports his pain a 1/10 currently but stated the pain wakes him up at night sometimes and during these episodes it's a 7/10.  He takes 4gm tylenol a day. He reports walking aggrevate the pain. Patient is a Administrator.    Review of Systems  Constitutional: Negative.   HENT: Negative.   Eyes: Negative.   Respiratory: Negative.   Cardiovascular: Negative.   Gastrointestinal: Negative.   Musculoskeletal: Positive for back pain (lower back. ).  Neurological: Negative.   Hematological: Negative.   Psychiatric/Behavioral: Negative.   All other systems reviewed and are negative.      Objective:   Physical Exam  Constitutional: He is oriented to person, place, and time. He appears well-developed and well-nourished.  HENT:  Head: Normocephalic.  Eyes: Pupils are equal, round, and reactive to light.  Neck: Normal range of motion.  Cardiovascular: Normal rate.   No murmur heard. Pulmonary/Chest: Effort normal. No respiratory distress.  Neurological: He is alert and oriented to person, place, and time.  Able to lift legs, hip flexion without pain.      BP 131/82 mmHg  Pulse 69  Temp(Src) 96.9 F (36.1 C) (Oral)  Ht $R'5\' 8"'uG$  (1.727 m)  Wt 164 lb (74.39 kg)  BMI 24.94 kg/m2      Assessment & Plan:   1. Chronic back pain    Meds ordered this encounter  Medications  . DULoxetine (CYMBALTA) 30 MG capsule    Sig: Take 1 capsule (30 mg total) by mouth daily.    Dispense:  30 capsule    Refill:  0    Order Specific Question:  Supervising Provider    Answer:  Chipper Herb [[id number]]  . DULoxetine (CYMBALTA) 60 MG  capsule    Sig: Take 1 capsule (60 mg total) by mouth daily.    Dispense:  30 capsule    Refill:  3    Order Specific Question:  Supervising Provider    Answer:  Joycelyn Man   Will start with cymbalta 30 mg X1 month then increase to $RemoveBef'60mg'AzdcDqrybL$  daily Back exercises RTO prn Orders Placed This Encounter  Procedures  . [place], FNP

## 2014-02-19 LAB — CMP14+EGFR
ALK PHOS: 70 IU/L (ref 39–117)
ALT: 16 IU/L (ref 0–44)
AST: 20 IU/L (ref 0–40)
Albumin/Globulin Ratio: 2 (ref 1.1–2.5)
Albumin: 4.2 g/dL (ref 3.6–4.8)
BILIRUBIN TOTAL: 0.4 mg/dL (ref 0.0–1.2)
BUN/Creatinine Ratio: 15 (ref 10–22)
BUN: 15 mg/dL (ref 8–27)
CALCIUM: 9.2 mg/dL (ref 8.6–10.2)
CHLORIDE: 103 mmol/L (ref 97–108)
CO2: 24 mmol/L (ref 18–29)
Creatinine, Ser: 1.01 mg/dL (ref 0.76–1.27)
GFR calc Af Amer: 93 mL/min/{1.73_m2} (ref >59)
GFR, EST NON AFRICAN AMERICAN: 80 mL/min/{1.73_m2} (ref >59)
GLOBULIN, TOTAL: 2.1 g/dL (ref 1.5–4.5)
GLUCOSE: 78 mg/dL (ref 65–99)
POTASSIUM: 4.1 mmol/L (ref 3.5–5.2)
Sodium: 142 mmol/L (ref 134–144)
TOTAL PROTEIN: 6.3 g/dL (ref 6.0–8.5)

## 2014-02-19 MED ORDER — RABEPRAZOLE SODIUM 20 MG PO TBEC
20.0000 mg | DELAYED_RELEASE_TABLET | Freq: Every day | ORAL | Status: DC
Start: 2014-02-19 — End: 2014-02-19

## 2014-02-19 MED ORDER — RABEPRAZOLE SODIUM 20 MG PO TBEC: 20.0000 mg | DELAYED_RELEASE_TABLET | Freq: Every day | ORAL | Status: DC

## 2014-02-19 NOTE — Telephone Encounter (Signed)
[facility]

## 2014-02-19 NOTE — Telephone Encounter (Signed)
aciphex rx sent to pharmacy

## 2014-02-19 NOTE — Addendum Note (Signed)
Addended by: Thana Ates on: 02/19/2014 09:23 AM   Modules accepted: Orders

## 2014-02-21 ENCOUNTER — Other Ambulatory Visit: Payer: Self-pay | Admitting: *Deleted

## 2014-02-21 MED ORDER — DULOXETINE HCL 30 MG PO CPEP: 30.0000 mg | ORAL_CAPSULE | Freq: Every day | ORAL | Status: DC

## 2014-02-21 MED ORDER — DULOXETINE HCL 60 MG PO CPEP: 60.0000 mg | ORAL_CAPSULE | Freq: Every day | ORAL | Status: DC

## 2014-02-22 MED ORDER — RABEPRAZOLE SODIUM 20 MG PO TBEC: 20.0000 mg | DELAYED_RELEASE_TABLET | Freq: Every day | ORAL | Status: DC

## 2014-02-22 NOTE — Addendum Note (Signed)
Addended by: Thana Ates on: 02/22/2014 03:37 PM   Modules accepted: Orders

## 2014-04-29 ENCOUNTER — Other Ambulatory Visit: Payer: Self-pay | Admitting: Nurse Practitioner

## 2014-04-29 MED ORDER — RABEPRAZOLE SODIUM 20 MG PO TBEC: 20.0000 mg | DELAYED_RELEASE_TABLET | Freq: Every day | ORAL | Status: DC

## 2014-04-29 NOTE — Telephone Encounter (Signed)
done

## 2014-06-05 ENCOUNTER — Ambulatory Visit (INDEPENDENT_AMBULATORY_CARE_PROVIDER_SITE_OTHER): Payer: BLUE CROSS/BLUE SHIELD | Admitting: Nurse Practitioner

## 2014-06-05 ENCOUNTER — Ambulatory Visit (INDEPENDENT_AMBULATORY_CARE_PROVIDER_SITE_OTHER): Payer: BLUE CROSS/BLUE SHIELD

## 2014-06-05 ENCOUNTER — Encounter: Payer: Self-pay | Admitting: Nurse Practitioner

## 2014-06-05 VITALS — BP 120/77 | HR 64 | Temp 97.3°F | Ht 68.0 in | Wt 165.8 lb

## 2014-06-05 DIAGNOSIS — Z125 Encounter for screening for malignant neoplasm of prostate: Secondary | ICD-10-CM | POA: Diagnosis not present

## 2014-06-05 DIAGNOSIS — Z8249 Family history of ischemic heart disease and other diseases of the circulatory system: Secondary | ICD-10-CM | POA: Diagnosis not present

## 2014-06-05 DIAGNOSIS — G8929 Other chronic pain: Secondary | ICD-10-CM | POA: Diagnosis not present

## 2014-06-05 DIAGNOSIS — K219 Gastro-esophageal reflux disease without esophagitis: Secondary | ICD-10-CM | POA: Diagnosis not present

## 2014-06-05 DIAGNOSIS — N529 Male erectile dysfunction, unspecified: Secondary | ICD-10-CM | POA: Diagnosis not present

## 2014-06-05 DIAGNOSIS — M549 Dorsalgia, unspecified: Secondary | ICD-10-CM | POA: Diagnosis not present

## 2014-06-05 MED ORDER — RABEPRAZOLE SODIUM 20 MG PO TBEC: 20.0000 mg | DELAYED_RELEASE_TABLET | Freq: Every day | ORAL | Status: DC

## 2014-06-05 MED ORDER — CIALIS 20 MG PO TABS: ORAL_TABLET | ORAL | Status: DC

## 2014-06-05 NOTE — Progress Notes (Signed)
Subjective:    Patient ID: Evan Baker, male    DOB: May 08, 1955, 59 y.o.   MRN: [id number]  Patient here today for follow up of chronic medical problems. He has a strong family history of MI.   Back Pain This is a chronic (seeing a specialist in [place]- currently has himon meloxicam.) problem. The current episode started more than 1 year ago. The problem occurs constantly. The problem has been gradually worsening since onset. The pain is present in the lumbar spine. The quality of the pain is described as aching. The pain radiates to the left thigh. The pain is at a severity of 7/10. The pain is moderate. The pain is worse during the night. Stiffness is present in the morning. Pertinent negatives include no abdominal pain, bladder incontinence, bowel incontinence, chest pain, numbness, paresthesias or weakness. He has tried analgesics for the symptoms. The treatment provided moderate relief.  Gastrophageal Reflux He complains of belching and heartburn. He reports no abdominal pain, no chest pain or no coughing. This is a chronic problem. The current episode started more than 1 year ago. The problem occurs occasionally. The problem has been gradually improving. The heartburn duration is less than a minute. The heartburn is of mild intensity. The heartburn does not wake him from sleep. The heartburn does not limit his activity. The heartburn doesn't change with position. The symptoms are aggravated by certain foods. There are no known risk factors. He has tried a PPI for the symptoms.  erectile dysfunction  very poor functioning without taking cialis- no side effects from meds other than occasional headache  Review of Systems  Constitutional: Negative.   HENT: Negative.   Eyes: Negative.   Respiratory: Negative for cough.   Cardiovascular: Negative.  Negative for chest pain.  Gastrointestinal: Positive for heartburn. Negative for abdominal pain and bowel incontinence.  Endocrine:  Negative.   Genitourinary: Negative.  Negative for bladder incontinence.  Musculoskeletal: Positive for back pain.  Skin: Negative.   Allergic/Immunologic: Negative.   Neurological: Negative.  Negative for weakness, numbness and paresthesias.  Hematological: Negative.   Psychiatric/Behavioral: Negative.        Objective:   Physical Exam  Constitutional: He is oriented to person, place, and time. He appears well-developed and well-nourished.  HENT:  Head: Normocephalic.  Eyes: Pupils are equal, round, and reactive to light.  Neck: Normal range of motion.  Cardiovascular: Normal rate.   Pulmonary/Chest: Effort normal.  Abdominal: Soft.  Neurological: He is alert and oriented to person, place, and time.  Skin: Skin is warm.  Psychiatric: He has a normal mood and affect.   BP 120/77 mmHg  Pulse 64  Temp(Src) 97.3 F (36.3 C) (Oral)  Ht _0  (1.727 m)  Wt 165 lb 12.8 oz (75.206 kg)  BMI 25.22 kg/m2   Chest x ray- normal-Preliminary reading by Ronnald Collum, FNP  [facility]   EKG- Kerry Hough, FNP      Assessment & Plan:  1. Gastroesophageal reflux disease, esophagitis presence not specified Avoid spicy foods Do not eat 2 hours prior to bedtime - RABEprazole (ACIPHEX) 20 MG tablet; Take 1 tablet (20 mg total) by mouth daily.  Dispense: 90 tablet; Refill: 1  2. Chronic back pain Keep follow up with specialist  3. Family history of acute myocardial infarction - CMP14+EGFR - NMR, lipoprofile - DG Chest 2 View; Future - EKG 12-Lead  4. Prostate cancer screening  - PSA, total and free  5. Erectile dysfunction, unspecified erectile dysfunction type  -  CIALIS 20 MG tablet; TAKE ONE-HALF (1/2) TO 1 TABLET EVERY TWO TO THREE DAYS OR AS NEEDED  Dispense: 18 tablet; Refill: 2    Labs pending Health maintenance reviewed Diet and exercise encouraged Continue all meds Follow up  In 6 months   [place], FNP

## 2014-06-05 NOTE — Patient Instructions (Signed)
Fat and Cholesterol Control Diet Fat and cholesterol levels in your blood and organs are influenced by your diet. High levels of fat and cholesterol may lead to diseases of the heart, small and large blood vessels, gallbladder, liver, and pancreas. CONTROLLING FAT AND CHOLESTEROL WITH DIET Although exercise and lifestyle factors are important, your diet is key. That is because certain foods are known to raise cholesterol and others to lower it. The goal is to balance foods for their effect on cholesterol and more importantly, to replace saturated and trans fat with other types of fat, such as monounsaturated fat, polyunsaturated fat, and omega-3 fatty acids. On average, a person should consume no more than 15 to 17 g of saturated fat daily. Saturated and trans fats are considered "bad" fats, and they will raise LDL cholesterol. Saturated fats are primarily found in animal products such as meats, butter, and cream. However, that does not mean you need to give up all your favorite foods. Today, there are good tasting, low-fat, low-cholesterol substitutes for most of the things you like to eat. Choose low-fat or nonfat alternatives. Choose round or loin cuts of red meat. These types of cuts are lowest in fat and cholesterol. Chicken (without the skin), fish, veal, and ground turkey breast are great choices. Eliminate fatty meats, such as hot dogs and salami. Even shellfish have little or no saturated fat. Have a 3 oz (85 g) portion when you eat lean meat, poultry, or fish. Trans fats are also called "partially hydrogenated oils." They are oils that have been scientifically manipulated so that they are solid at room temperature resulting in a longer shelf life and improved taste and texture of foods in which they are added. Trans fats are found in stick margarine, some tub margarines, cookies, crackers, and baked goods.  When baking and cooking, oils are a great substitute for butter. The monounsaturated oils are  especially beneficial since it is believed they lower LDL and raise HDL. The oils you should avoid entirely are saturated tropical oils, such as coconut and palm.  Remember to eat a lot from food groups that are naturally free of saturated and trans fat, including fish, fruit, vegetables, beans, grains (barley, rice, couscous, bulgur wheat), and pasta (without cream sauces).  IDENTIFYING FOODS THAT LOWER FAT AND CHOLESTEROL  Soluble fiber may lower your cholesterol. This type of fiber is found in fruits such as apples, vegetables such as broccoli, potatoes, and carrots, legumes such as beans, peas, and lentils, and grains such as barley. Foods fortified with plant sterols (phytosterol) may also lower cholesterol. You should eat at least 2 g per day of these foods for a cholesterol lowering effect.  Read package labels to identify low-saturated fats, trans fat free, and low-fat foods at the supermarket. Select cheeses that have only 2 to 3 g saturated fat per ounce. Use a heart-healthy tub margarine that is free of trans fats or partially hydrogenated oil. When buying baked goods (cookies, crackers), avoid partially hydrogenated oils. Breads and muffins should be made from whole grains (whole-wheat or whole oat flour, instead of "flour" or "enriched flour"). Buy non-creamy canned soups with reduced salt and no added fats.  FOOD PREPARATION TECHNIQUES  Never deep-fry. If you must fry, either stir-fry, which uses very little fat, or use non-stick cooking sprays. When possible, broil, bake, or roast meats, and steam vegetables. Instead of putting butter or margarine on vegetables, use lemon and herbs, applesauce, and cinnamon (for squash and sweet potatoes). Use nonfat   yogurt, salsa, and low-fat dressings for salads.  LOW-SATURATED FAT / LOW-FAT FOOD SUBSTITUTES Meats / Saturated Fat (g)  Avoid: Steak, marbled (3 oz/85 g) / 11 g  Choose: Steak, lean (3 oz/85 g) / 4 g  Avoid: Hamburger (3 oz/85 g) / 7  g  Choose: Hamburger, lean (3 oz/85 g) / 5 g  Avoid: Ham (3 oz/85 g) / 6 g  Choose: Ham, lean cut (3 oz/85 g) / 2.4 g  Avoid: Chicken, with skin, dark meat (3 oz/85 g) / 4 g  Choose: Chicken, skin removed, dark meat (3 oz/85 g) / 2 g  Avoid: Chicken, with skin, light meat (3 oz/85 g) / 2.5 g  Choose: Chicken, skin removed, light meat (3 oz/85 g) / 1 g Dairy / Saturated Fat (g)  Avoid: Whole milk (1 cup) / 5 g  Choose: Low-fat milk, 2% (1 cup) / 3 g  Choose: Low-fat milk, 1% (1 cup) / 1.5 g  Choose: Skim milk (1 cup) / 0.3 g  Avoid: Hard cheese (1 oz/28 g) / 6 g  Choose: Skim milk cheese (1 oz/28 g) / 2 to 3 g  Avoid: Cottage cheese, 4% fat (1 cup) / 6.5 g  Choose: Low-fat cottage cheese, 1% fat (1 cup) / 1.5 g  Avoid: Ice cream (1 cup) / 9 g  Choose: Sherbet (1 cup) / 2.5 g  Choose: Nonfat frozen yogurt (1 cup) / 0.3 g  Choose: Frozen fruit bar / trace  Avoid: Whipped cream (1 tbs) / 3.5 g  Choose: Nondairy whipped topping (1 tbs) / 1 g Condiments / Saturated Fat (g)  Avoid: Mayonnaise (1 tbs) / 2 g  Choose: Low-fat mayonnaise (1 tbs) / 1 g  Avoid: Butter (1 tbs) / 7 g  Choose: Extra light margarine (1 tbs) / 1 g  Avoid: Coconut oil (1 tbs) / 11.8 g  Choose: Olive oil (1 tbs) / 1.8 g  Choose: Corn oil (1 tbs) / 1.7 g  Choose: Safflower oil (1 tbs) / 1.2 g  Choose: Sunflower oil (1 tbs) / 1.4 g  Choose: Soybean oil (1 tbs) / 2.4 g  Choose: Canola oil (1 tbs) / 1 g Document Released: 12/28/2004 Document Revised: 04/24/2012 Document Reviewed: 03/28/2013 [employer] Patient Information 2015 [employer]. This information is not intended to replace advice given to you by your health care provider. Make sure you discuss any questions you have with your health care provider.  

## 2014-06-06 LAB — CMP14+EGFR
A/G RATIO: 2 (ref 1.1–2.5)
ALT: 14 IU/L (ref 0–44)
AST: 17 IU/L (ref 0–40)
Albumin: 4.5 g/dL (ref 3.6–4.8)
Alkaline Phosphatase: 70 IU/L (ref 39–117)
BUN/Creatinine Ratio: 14 (ref 10–22)
BUN: 15 mg/dL (ref 8–27)
Bilirubin Total: 0.6 mg/dL (ref 0.0–1.2)
CO2: 25 mmol/L (ref 18–29)
CREATININE: 1.07 mg/dL (ref 0.76–1.27)
Calcium: 9.2 mg/dL (ref 8.6–10.2)
Chloride: 101 mmol/L (ref 97–108)
GFR calc non Af Amer: 75 mL/min/{1.73_m2} (ref >59)
GFR, EST AFRICAN AMERICAN: 86 mL/min/{1.73_m2} (ref >59)
GLOBULIN, TOTAL: 2.3 g/dL (ref 1.5–4.5)
Glucose: 69 mg/dL (ref 65–99)
Potassium: 4.3 mmol/L (ref 3.5–5.2)
Sodium: 140 mmol/L (ref 134–144)
Total Protein: 6.8 g/dL (ref 6.0–8.5)

## 2014-06-06 LAB — NMR, LIPOPROFILE
Cholesterol: 180 mg/dL (ref 100–199)
HDL CHOLESTEROL BY NMR: 46 mg/dL (ref >39)
HDL PARTICLE NUMBER: 31.7 umol/L (ref >=30.5)
LDL Particle Number: 1317 nmol/L — ABNORMAL HIGH (ref <1000)
LDL Size: 21 nm (ref >20.5)
LDL-C: 110 mg/dL — ABNORMAL HIGH (ref 0–99)
LP-IR SCORE: 71 — AB (ref <=45)
Small LDL Particle Number: 526 nmol/L (ref <=527)
TRIGLYCERIDES BY NMR: 121 mg/dL (ref 0–149)

## 2014-06-06 LAB — PSA, TOTAL AND FREE
PSA, Free Pct: 46.7 %
PSA, Free: 0.42 ng/mL
Prostate Specific Ag, Serum: 0.9 ng/mL (ref 0.0–4.0)

## 2014-06-11 ENCOUNTER — Ambulatory Visit (INDEPENDENT_AMBULATORY_CARE_PROVIDER_SITE_OTHER): Payer: BLUE CROSS/BLUE SHIELD | Admitting: Urology

## 2014-06-11 DIAGNOSIS — N320 Bladder-neck obstruction: Secondary | ICD-10-CM

## 2014-06-11 DIAGNOSIS — N529 Male erectile dysfunction, unspecified: Secondary | ICD-10-CM

## 2014-09-03 ENCOUNTER — Ambulatory Visit: Payer: BLUE CROSS/BLUE SHIELD | Admitting: Nurse Practitioner

## 2014-11-08 ENCOUNTER — Ambulatory Visit: Payer: BLUE CROSS/BLUE SHIELD | Admitting: Nurse Practitioner

## 2014-11-11 ENCOUNTER — Encounter: Payer: Self-pay | Admitting: Nurse Practitioner

## 2014-11-11 ENCOUNTER — Ambulatory Visit (INDEPENDENT_AMBULATORY_CARE_PROVIDER_SITE_OTHER): Payer: BLUE CROSS/BLUE SHIELD | Admitting: Nurse Practitioner

## 2014-11-11 VITALS — BP 123/79 | HR 65 | Temp 97.0°F | Ht 68.0 in | Wt 166.0 lb

## 2014-11-11 DIAGNOSIS — Z01818 Encounter for other preprocedural examination: Secondary | ICD-10-CM | POA: Insufficient documentation

## 2014-11-11 NOTE — Progress Notes (Signed)
   Subjective:    Patient ID: Evan Baker, male    DOB: July 07, 1955, 59 y.o.   MRN: [id number]  HPI: Reports he is here for surgical clearance for surgery to the lower back by Dr. Dewitt Hoes in [place]. Says the surgery is suppose to be scheduled for Friday. No complaints today.     Review of Systems  Respiratory: Negative for apnea, cough, chest tightness and shortness of breath.   Cardiovascular: Negative for chest pain, palpitations and leg swelling.  Neurological: Negative for dizziness, weakness, light-headedness and numbness.  All other systems reviewed and are negative.      Objective:   Physical Exam  Constitutional: He is oriented to person, place, and time. He appears well-developed and well-nourished.  HENT:  Head: Normocephalic.  Mouth/Throat: Oropharynx is clear and moist.  Eyes: Pupils are equal, round, and reactive to light.  Neck: Normal range of motion. Neck supple.  Cardiovascular: Normal rate, regular rhythm, normal heart sounds and intact distal pulses.   Pulmonary/Chest: Effort normal and breath sounds normal.  Abdominal: Soft. Bowel sounds are normal.  Musculoskeletal: Normal range of motion.  Neurological: He is alert and oriented to person, place, and time.  Skin: Skin is warm and dry.  Psychiatric: He has a normal mood and affect. His behavior is normal. Judgment and thought content normal.     BP 123/79 mmHg  Pulse 65  Temp(Src) 97 F (36.1 C) (Oral)  Ht 5\' 8"  (1.727 m)  Wt 166 lb (75.297 kg)  BMI 25.25 kg/m2     Assessment & Plan:  Preoperative clearance -RCRI-Low risk -EKG 5/15 NSR -Chest x-ray 5/15 No active cardiopulmonary disease Medically cleared for surgery Mary-Margaret Hassell Done, FNP

## 2014-12-30 ENCOUNTER — Other Ambulatory Visit: Payer: Self-pay | Admitting: *Deleted

## 2014-12-30 DIAGNOSIS — K219 Gastro-esophageal reflux disease without esophagitis: Secondary | ICD-10-CM

## 2014-12-30 DIAGNOSIS — N529 Male erectile dysfunction, unspecified: Secondary | ICD-10-CM

## 2014-12-30 MED ORDER — RABEPRAZOLE SODIUM 20 MG PO TBEC: 20.0000 mg | DELAYED_RELEASE_TABLET | Freq: Every day | ORAL | Status: DC

## 2014-12-30 MED ORDER — CIALIS 20 MG PO TABS: ORAL_TABLET | ORAL | Status: DC

## 2015-02-20 ENCOUNTER — Telehealth: Payer: Self-pay | Admitting: Nurse Practitioner

## 2015-02-20 NOTE — Telephone Encounter (Signed)
Pharmacy changed

## 2015-03-03 ENCOUNTER — Telehealth: Payer: Self-pay | Admitting: Nurse Practitioner

## 2015-03-03 DIAGNOSIS — N529 Male erectile dysfunction, unspecified: Secondary | ICD-10-CM

## 2015-03-07 NOTE — Telephone Encounter (Signed)
On hold for15 minutes, had to hang up

## 2015-03-10 MED ORDER — CIALIS 20 MG PO TABS: ORAL_TABLET | ORAL | Status: DC

## 2015-03-10 NOTE — Telephone Encounter (Signed)
They need clearer direction, q 3 to 4 days will not work. Sent 3 mo. [facility]

## 2015-04-23 ENCOUNTER — Other Ambulatory Visit: Payer: Self-pay

## 2015-04-23 DIAGNOSIS — K219 Gastro-esophageal reflux disease without esophagitis: Secondary | ICD-10-CM

## 2015-04-23 MED ORDER — RABEPRAZOLE SODIUM 20 MG PO TBEC: 20.0000 mg | DELAYED_RELEASE_TABLET | Freq: Every day | ORAL | Status: DC

## 2015-06-11 DIAGNOSIS — K219 Gastro-esophageal reflux disease without esophagitis: Secondary | ICD-10-CM | POA: Diagnosis not present

## 2015-06-11 DIAGNOSIS — Z1389 Encounter for screening for other disorder: Secondary | ICD-10-CM | POA: Diagnosis not present

## 2015-06-11 DIAGNOSIS — Z008 Encounter for other general examination: Secondary | ICD-10-CM | POA: Diagnosis not present

## 2015-06-11 DIAGNOSIS — E786 Lipoprotein deficiency: Secondary | ICD-10-CM | POA: Diagnosis not present

## 2015-06-27 ENCOUNTER — Telehealth: Payer: Self-pay | Admitting: Nurse Practitioner

## 2015-06-27 DIAGNOSIS — K219 Gastro-esophageal reflux disease without esophagitis: Secondary | ICD-10-CM

## 2015-06-27 MED ORDER — RABEPRAZOLE SODIUM 20 MG PO TBEC: 20.0000 mg | DELAYED_RELEASE_TABLET | Freq: Every day | ORAL | Status: DC

## 2015-06-27 NOTE — Telephone Encounter (Signed)
Patient notified that rx sent in.

## 2015-08-22 ENCOUNTER — Other Ambulatory Visit: Payer: Self-pay | Admitting: Nurse Practitioner

## 2015-08-22 DIAGNOSIS — K219 Gastro-esophageal reflux disease without esophagitis: Secondary | ICD-10-CM

## 2015-08-25 NOTE — Telephone Encounter (Signed)
Last refill without being seen 

## 2015-08-25 NOTE — Telephone Encounter (Signed)
LMOVM that refill sent & NTBS before next refill

## 2015-08-29 ENCOUNTER — Other Ambulatory Visit: Payer: Self-pay | Admitting: Nurse Practitioner

## 2015-08-29 DIAGNOSIS — N529 Male erectile dysfunction, unspecified: Secondary | ICD-10-CM

## 2015-09-23 ENCOUNTER — Encounter: Payer: Self-pay | Admitting: Nurse Practitioner

## 2015-09-23 ENCOUNTER — Ambulatory Visit (INDEPENDENT_AMBULATORY_CARE_PROVIDER_SITE_OTHER): Payer: BLUE CROSS/BLUE SHIELD | Admitting: Nurse Practitioner

## 2015-09-23 VITALS — BP 124/75 | HR 65 | Temp 97.0°F | Ht 68.0 in | Wt 166.0 lb

## 2015-09-23 DIAGNOSIS — N529 Male erectile dysfunction, unspecified: Secondary | ICD-10-CM

## 2015-09-23 DIAGNOSIS — Z0000 Encounter for general adult medical examination without abnormal findings: Secondary | ICD-10-CM

## 2015-09-23 DIAGNOSIS — Z1159 Encounter for screening for other viral diseases: Secondary | ICD-10-CM | POA: Diagnosis not present

## 2015-09-23 DIAGNOSIS — K219 Gastro-esophageal reflux disease without esophagitis: Secondary | ICD-10-CM

## 2015-09-23 DIAGNOSIS — C61 Malignant neoplasm of prostate: Secondary | ICD-10-CM

## 2015-09-23 DIAGNOSIS — M549 Dorsalgia, unspecified: Secondary | ICD-10-CM

## 2015-09-23 DIAGNOSIS — M5432 Sciatica, left side: Secondary | ICD-10-CM

## 2015-09-23 DIAGNOSIS — I493 Ventricular premature depolarization: Secondary | ICD-10-CM

## 2015-09-23 DIAGNOSIS — G8929 Other chronic pain: Secondary | ICD-10-CM

## 2015-09-23 MED ORDER — METHYLPREDNISOLONE ACETATE 80 MG/ML IJ SUSP
80.0000 mg | Freq: Once | INTRAMUSCULAR | Status: AC
  Administered 2015-09-23: 80 mg via INTRAMUSCULAR

## 2015-09-23 MED ORDER — RABEPRAZOLE SODIUM 20 MG PO TBEC: 20.0000 mg | DELAYED_RELEASE_TABLET | Freq: Every day | ORAL | 1 refills | Status: DC

## 2015-09-23 MED ORDER — CIALIS 20 MG PO TABS: 20.0000 mg | ORAL_TABLET | ORAL | 3 refills | Status: DC | PRN

## 2015-09-23 NOTE — Patient Instructions (Signed)

## 2015-09-23 NOTE — Progress Notes (Signed)
Subjective:    Patient ID: Evan Baker, male    DOB: 05-06-1955, 60 y.o.   MRN: [id number]  Patient here today for annual physical exam and  follow up of chronic medical problems.  Outpatient Encounter Prescriptions as of 09/23/2015  Medication Sig  . CIALIS 20 MG tablet Take 1 tablet (20 mg total) by mouth as needed.  . RABEprazole (ACIPHEX) 20 MG tablet Take 1 tablet (20 mg total) by mouth daily.   * Had back surgery in November 2016 and still has occasional flare ups of sciatica- currently has pain radiating down left leg- rates pain 5/10 currently- had steroid shot a few months ago which helped- nerosurgeon is all the way in [place]. So would like to get shot today  Gastroesophageal Reflux  He complains of belching and heartburn. He reports no coughing. This is a chronic problem. The current episode started more than 1 year ago. The problem occurs occasionally. The problem has been gradually improving. The heartburn duration is less than a minute. The heartburn is of mild intensity. The heartburn does not wake him from sleep. The heartburn does not limit his activity. The heartburn doesn't change with position. The symptoms are aggravated by certain foods. There are no known risk factors. He has tried a PPI for the symptoms.  erectile dysfunction  very poor functioning without taking cialis- no side effects from meds other than occasional headache  Review of Systems  Constitutional: Negative.   HENT: Negative.   Eyes: Negative.   Respiratory: Negative for cough.   Cardiovascular: Negative.   Gastrointestinal: Positive for heartburn.  Endocrine: Negative.   Genitourinary: Negative.   Skin: Negative.   Allergic/Immunologic: Negative.   Neurological: Negative.   Hematological: Negative.   Psychiatric/Behavioral: Negative.        Objective:   Physical Exam  Constitutional: He is oriented to person, place, and time. He appears well-developed and well-nourished.    HENT:  Head: Normocephalic.  Right Ear: External ear normal.  Left Ear: External ear normal.  Nose: Nose normal.  Mouth/Throat: Oropharynx is clear and moist.  Eyes: EOM are normal. Pupils are equal, round, and reactive to light.  Neck: Normal range of motion. Neck supple. No JVD present. No thyromegaly present.  Cardiovascular: Normal rate, regular rhythm, normal heart sounds and intact distal pulses.  Exam reveals no gallop and no friction rub.   No murmur heard. Pulmonary/Chest: Effort normal and breath sounds normal. No respiratory distress. He has no wheezes. He has no rales. He exhibits no tenderness.  Abdominal: Soft. Bowel sounds are normal. He exhibits no mass. There is no tenderness.  Genitourinary: Prostate normal and penis normal.  Musculoskeletal: Normal range of motion. He exhibits no edema.  (-) SLR bil FROM of lumbar spine without pain  Lymphadenopathy:    He has no cervical adenopathy.  Neurological: He is alert and oriented to person, place, and time. No cranial nerve deficit.  Skin: Skin is warm and dry.  Psychiatric: He has a normal mood and affect. His behavior is normal. Judgment and thought content normal.   BP 124/75   Pulse 65   Temp 97 F (36.1 C) (Oral)   Ht '5\' 8"'  (1.727 m)   Wt 166 lb (75.3 kg)   BMI 25.24 kg/m        Assessment & Plan:  1. Annual physical exam - CBC with Differential/Platelet - CMP14+EGFR - Lipid panel  2. PVC (premature ventricular contraction)  3. Gastroesophageal reflux disease, esophagitis  presence not specified Avoid spicy foods Do not eat 2 hours prior to bedtime - RABEprazole (ACIPHEX) 20 MG tablet; Take 1 tablet (20 mg total) by mouth daily.  Dispense: 90 tablet; Refill: 1  4. Erectile dysfunction, unspecified erectile dysfunction type - CIALIS 20 MG tablet; Take 1 tablet (20 mg total) by mouth as needed.  Dispense: 18 tablet; Refill: 3  5. Chronic back pain Keep follow up appointment with neurosurgeon  6.  Sciatica of left side - methylPREDNISolone acetate (DEPO-MEDROL) injection 80 mg; Inject 1 mL (80 mg total) into the muscle once.  7. Prostate cancer ([place]) - PSA, total and free    Labs pending Health maintenance reviewed Diet and exercise encouraged Continue all meds Follow up  In 6 months   [place], FNP

## 2015-09-24 LAB — CMP14+EGFR
A/G RATIO: 2 (ref 1.2–2.2)
ALK PHOS: 77 IU/L (ref 39–117)
ALT: 12 IU/L (ref 0–44)
AST: 20 IU/L (ref 0–40)
Albumin: 4.4 g/dL (ref 3.6–4.8)
BUN/Creatinine Ratio: 12 (ref 10–24)
BUN: 13 mg/dL (ref 8–27)
Bilirubin Total: 0.8 mg/dL (ref 0.0–1.2)
CALCIUM: 9.4 mg/dL (ref 8.6–10.2)
CO2: 24 mmol/L (ref 18–29)
Chloride: 103 mmol/L (ref 96–106)
Creatinine, Ser: 1.07 mg/dL (ref 0.76–1.27)
GFR calc Af Amer: 86 mL/min/{1.73_m2} (ref >59)
GFR, EST NON AFRICAN AMERICAN: 74 mL/min/{1.73_m2} (ref >59)
GLOBULIN, TOTAL: 2.2 g/dL (ref 1.5–4.5)
Glucose: 71 mg/dL (ref 65–99)
POTASSIUM: 4.5 mmol/L (ref 3.5–5.2)
SODIUM: 142 mmol/L (ref 134–144)
Total Protein: 6.6 g/dL (ref 6.0–8.5)

## 2015-09-24 LAB — CBC WITH DIFFERENTIAL/PLATELET
BASOS ABS: 0.1 10*3/uL (ref 0.0–0.2)
BASOS: 1 %
EOS (ABSOLUTE): 0 10*3/uL (ref 0.0–0.4)
Eos: 1 %
HEMOGLOBIN: 13.7 g/dL (ref 12.6–17.7)
Hematocrit: 41.5 % (ref 37.5–51.0)
Immature Grans (Abs): 0 10*3/uL (ref 0.0–0.1)
Immature Granulocytes: 0 %
LYMPHS ABS: 1.5 10*3/uL (ref 0.7–3.1)
Lymphs: 23 %
MCH: 28.3 pg (ref 26.6–33.0)
MCHC: 33 g/dL (ref 31.5–35.7)
MCV: 86 fL (ref 79–97)
MONOCYTES: 9 %
Monocytes Absolute: 0.6 10*3/uL (ref 0.1–0.9)
NEUTROS ABS: 4.4 10*3/uL (ref 1.4–7.0)
Neutrophils: 66 %
Platelets: 340 10*3/uL (ref 150–379)
RBC: 4.84 x10E6/uL (ref 4.14–5.80)
RDW: 15.7 % — ABNORMAL HIGH (ref 12.3–15.4)
WBC: 6.7 10*3/uL (ref 3.4–10.8)

## 2015-09-24 LAB — LIPID PANEL
CHOLESTEROL TOTAL: 155 mg/dL (ref 100–199)
Chol/HDL Ratio: 3.6 ratio units (ref 0.0–5.0)
HDL: 43 mg/dL (ref >39)
LDL Calculated: 92 mg/dL (ref 0–99)
TRIGLYCERIDES: 102 mg/dL (ref 0–149)
VLDL Cholesterol Cal: 20 mg/dL (ref 5–40)

## 2015-09-24 LAB — HEPATITIS C ANTIBODY

## 2015-09-24 LAB — PSA, TOTAL AND FREE
PSA FREE PCT: 31.2 %
PSA, Free: 0.53 ng/mL
Prostate Specific Ag, Serum: 1.7 ng/mL (ref 0.0–4.0)

## 2015-11-10 DIAGNOSIS — W5501XA Bitten by cat, initial encounter: Secondary | ICD-10-CM | POA: Diagnosis not present

## 2015-11-10 DIAGNOSIS — S61258A Open bite of other finger without damage to nail, initial encounter: Secondary | ICD-10-CM | POA: Diagnosis not present

## 2015-12-19 ENCOUNTER — Telehealth: Payer: Self-pay | Admitting: Nurse Practitioner

## 2015-12-22 ENCOUNTER — Other Ambulatory Visit: Payer: Self-pay | Admitting: Nurse Practitioner

## 2015-12-22 DIAGNOSIS — N529 Male erectile dysfunction, unspecified: Secondary | ICD-10-CM

## 2015-12-22 MED ORDER — CIALIS 20 MG PO TABS: 20.0000 mg | ORAL_TABLET | ORAL | 3 refills | Status: DC | PRN

## 2015-12-22 NOTE — Telephone Encounter (Signed)
rx sent to pharmacy

## 2015-12-23 ENCOUNTER — Ambulatory Visit (INDEPENDENT_AMBULATORY_CARE_PROVIDER_SITE_OTHER): Payer: BLUE CROSS/BLUE SHIELD

## 2015-12-23 DIAGNOSIS — Z23 Encounter for immunization: Secondary | ICD-10-CM | POA: Diagnosis not present

## 2016-01-21 ENCOUNTER — Other Ambulatory Visit: Payer: Self-pay | Admitting: Nurse Practitioner

## 2016-01-21 DIAGNOSIS — K219 Gastro-esophageal reflux disease without esophagitis: Secondary | ICD-10-CM

## 2016-02-23 DIAGNOSIS — J019 Acute sinusitis, unspecified: Secondary | ICD-10-CM | POA: Diagnosis not present

## 2016-03-09 ENCOUNTER — Ambulatory Visit (INDEPENDENT_AMBULATORY_CARE_PROVIDER_SITE_OTHER): Payer: BLUE CROSS/BLUE SHIELD

## 2016-03-09 ENCOUNTER — Telehealth: Payer: Self-pay | Admitting: Nurse Practitioner

## 2016-03-09 ENCOUNTER — Other Ambulatory Visit: Payer: Self-pay | Admitting: Family

## 2016-03-09 ENCOUNTER — Encounter: Payer: Self-pay | Admitting: Family

## 2016-03-09 ENCOUNTER — Ambulatory Visit (INDEPENDENT_AMBULATORY_CARE_PROVIDER_SITE_OTHER): Payer: BLUE CROSS/BLUE SHIELD | Admitting: Family

## 2016-03-09 VITALS — BP 121/80 | HR 63 | Temp 96.8°F | Ht 68.0 in | Wt 164.2 lb

## 2016-03-09 DIAGNOSIS — J301 Allergic rhinitis due to pollen: Secondary | ICD-10-CM

## 2016-03-09 DIAGNOSIS — J40 Bronchitis, not specified as acute or chronic: Secondary | ICD-10-CM | POA: Diagnosis not present

## 2016-03-09 DIAGNOSIS — R05 Cough: Secondary | ICD-10-CM

## 2016-03-09 DIAGNOSIS — R059 Cough, unspecified: Secondary | ICD-10-CM

## 2016-03-09 MED ORDER — LEVOFLOXACIN 500 MG PO TABS: 500.0000 mg | ORAL_TABLET | Freq: Every day | ORAL | 0 refills | Status: DC

## 2016-03-09 MED ORDER — FLUTICASONE PROPIONATE 50 MCG/ACT NA SUSP: 2.0000 | Freq: Every day | NASAL | 6 refills | Status: DC

## 2016-03-09 MED ORDER — PREDNISONE 10 MG (21) PO TBPK: ORAL_TABLET | ORAL | 0 refills | Status: DC

## 2016-03-09 NOTE — Telephone Encounter (Signed)
Patient aware that rx was sent to [facility].

## 2016-03-09 NOTE — Progress Notes (Addendum)
Subjective:    Patient ID: Evan Baker, male    DOB: [birth year], 61 y.o.   MRN: [id number]  Cough  This is a recurrent problem. The current episode started more than 1 month ago. The problem has been gradually worsening. The problem occurs every few minutes. The cough is non-productive. Associated symptoms include headaches, nasal congestion, postnasal drip, rhinorrhea and shortness of breath. Pertinent negatives include no chills, ear congestion, ear pain, fever, myalgias or wheezing. The symptoms are aggravated by lying down. He has tried rest and OTC cough suppressant (zpak) for the symptoms. The treatment provided mild relief. There is no history of asthma or COPD.      Review of Systems  Constitutional: Negative for chills and fever.  HENT: Positive for postnasal drip and rhinorrhea. Negative for ear pain.   Respiratory: Positive for cough and shortness of breath. Negative for wheezing.   Musculoskeletal: Negative for myalgias.  Neurological: Positive for headaches.  All other systems reviewed and are negative.      Objective:   Physical Exam  Constitutional: He is oriented to person, place, and time. He appears well-developed and well-nourished. No distress.  HENT:  Head: Normocephalic.  Right Ear: External ear normal.  Left Ear: External ear normal.  Nose: Mucosal edema and rhinorrhea present.  Mouth/Throat: Posterior oropharyngeal erythema present.  Nasal passage erythemas with mild swelling   Eyes: Pupils are equal, round, and reactive to light. Right eye exhibits no discharge. Left eye exhibits no discharge.  Neck: Normal range of motion. Neck supple. No thyromegaly present.  Cardiovascular: Normal rate, regular rhythm, normal heart sounds and intact distal pulses.   No murmur heard. Pulmonary/Chest: Effort normal and breath sounds normal. No respiratory distress. He has no wheezes.  Dry intermittent cough   Abdominal: Soft. Bowel sounds are normal. He exhibits  no distension. There is no tenderness.  Musculoskeletal: Normal range of motion. He exhibits no edema or tenderness.  Neurological: He is alert and oriented to person, place, and time.  Skin: Skin is warm and dry. No rash noted. No erythema.  Psychiatric: He has a normal mood and affect. His behavior is normal. Judgment and thought content normal.  Vitals reviewed.  Chest x-ray- negative Preliminary reading by Evelina Dun, FNP [facility]   BP 121/80   Pulse 63   Temp (!) 96.8 F (36 C) (Oral)   Ht 5\' 8"  (1.727 m)   Wt 164 lb 3.2 oz (74.5 kg)   BMI 24.97 kg/m      Assessment & Plan:  1. Cough - DG Chest 2 View; Future - predniSONE (STERAPRED UNI-PAK 21 TAB) 10 MG (21) TBPK tablet; Use as directed  Dispense: 21 tablet; Refill: 0  2. Bronchitis -- Take meds as prescribed - Use a cool mist humidifier  -Use saline nose sprays frequently -Saline irrigations of the nose can be very helpful if done frequently.  * 4X daily for 1 week*  * Use of a nettie pot can be helpful with this. Follow directions with this* -Force fluids -For any cough or congestion  Use plain Mucinex- regular strength or max strength is fine   * Children- consult with Pharmacist for dosing -For fever or aces or pains- take tylenol or ibuprofen appropriate for age and weight.  * for fevers greater than 101 orally you may alternate ibuprofen and tylenol every  3 hours. -Throat lozenges if help -New toothbrush in 3 days - fluticasone (FLONASE) 50 MCG/ACT nasal spray; Place 2 sprays into both  nostrils daily.  Dispense: 16 g; Refill: 6 - predniSONE (STERAPRED UNI-PAK 21 TAB) 10 MG (21) TBPK tablet; Use as directed  Dispense: 21 tablet; Refill: 0  3. Chronic allergic rhinitis due to pollen, unspecified seasonality - fluticasone (FLONASE) 50 MCG/ACT nasal spray; Place 2 sprays into both nostrils daily.  Dispense: 16 g; Refill: 6 - predniSONE (STERAPRED UNI-PAK 21 TAB) 10 MG (21) TBPK tablet; Use as directed  Dispense:  21 tablet; Refill: 0   Evelina Dun, FNP

## 2016-03-09 NOTE — Patient Instructions (Signed)

## 2016-03-10 DIAGNOSIS — J189 Pneumonia, unspecified organism: Secondary | ICD-10-CM | POA: Diagnosis not present

## 2016-03-11 ENCOUNTER — Telehealth: Payer: Self-pay | Admitting: Nurse Practitioner

## 2016-03-11 NOTE — Telephone Encounter (Signed)
Ok to extend

## 2016-03-11 NOTE — Telephone Encounter (Signed)
Letter written and pt aware. Will fax to [facility].

## 2016-03-24 ENCOUNTER — Ambulatory Visit (INDEPENDENT_AMBULATORY_CARE_PROVIDER_SITE_OTHER): Payer: BLUE CROSS/BLUE SHIELD

## 2016-03-24 ENCOUNTER — Ambulatory Visit (INDEPENDENT_AMBULATORY_CARE_PROVIDER_SITE_OTHER): Payer: BLUE CROSS/BLUE SHIELD | Admitting: Family

## 2016-03-24 ENCOUNTER — Encounter: Payer: Self-pay | Admitting: Family

## 2016-03-24 ENCOUNTER — Other Ambulatory Visit: Payer: Self-pay | Admitting: Family

## 2016-03-24 VITALS — BP 133/91 | HR 69 | Temp 97.0°F | Ht 68.0 in | Wt 165.0 lb

## 2016-03-24 DIAGNOSIS — M7661 Achilles tendinitis, right leg: Secondary | ICD-10-CM | POA: Diagnosis not present

## 2016-03-24 DIAGNOSIS — K1370 Unspecified lesions of oral mucosa: Secondary | ICD-10-CM | POA: Diagnosis not present

## 2016-03-24 DIAGNOSIS — J189 Pneumonia, unspecified organism: Secondary | ICD-10-CM

## 2016-03-24 DIAGNOSIS — K219 Gastro-esophageal reflux disease without esophagitis: Secondary | ICD-10-CM | POA: Diagnosis not present

## 2016-03-24 MED ORDER — NAPROXEN 500 MG PO TABS: 500.0000 mg | ORAL_TABLET | Freq: Two times a day (BID) | ORAL | 0 refills | Status: DC

## 2016-03-24 MED ORDER — RABEPRAZOLE SODIUM 20 MG PO TBEC: 20.0000 mg | DELAYED_RELEASE_TABLET | Freq: Every day | ORAL | 4 refills | Status: DC

## 2016-03-24 MED ORDER — MAGIC MOUTHWASH: 5.0000 mL | Freq: Three times a day (TID) | ORAL | 0 refills | Status: DC | PRN

## 2016-03-24 NOTE — Progress Notes (Signed)
   Subjective:    Patient ID: Evan Baker, male    DOB: 14-Feb-1955, 61 y.o.   MRN: [id number]  HPI Pt presents to the office today to recheck CAP. PT was given Levaquin for 7 days. He reports his cough is gone and denies SOB.   Pt is complaining of right calf pain that started three days ago. PT states the pain was a 10 out 10 when he was walking, but now is improved and has soreness present.   He is also complaining of a blister on his tongue about three days ago that is improving. Pt states certain foods and drinks make the pain worse.    Review of Systems  HENT: Positive for mouth sores.   Musculoskeletal:       Right calf pain   All other systems reviewed and are negative.      Objective:   Physical Exam  Constitutional: He is oriented to person, place, and time. He appears well-developed and well-nourished. No distress.  HENT:  Head: Normocephalic.  Right Ear: External ear normal.  Left Ear: External ear normal.  Blister present on middle of tongue   Eyes: Pupils are equal, round, and reactive to light. Right eye exhibits no discharge. Left eye exhibits no discharge.  Neck: Normal range of motion. Neck supple. No thyromegaly present.  Cardiovascular: Normal rate, regular rhythm, normal heart sounds and intact distal pulses.   No murmur heard. Pulmonary/Chest: Effort normal and breath sounds normal. No respiratory distress. He has no wheezes.  Abdominal: Soft. Bowel sounds are normal. He exhibits no distension. There is no tenderness.  Musculoskeletal: Normal range of motion. He exhibits no edema or tenderness.  Tenderness in right calf, Full ROM of ankle  Neurological: He is alert and oriented to person, place, and time.  Skin: Skin is warm and dry. No rash noted. No erythema.  Psychiatric: He has a normal mood and affect. His behavior is normal. Judgment and thought content normal.  Vitals reviewed.   BP (!) 133/91   Pulse 69   Temp 97 F (36.1 C) (Oral)    Ht _0  (1.727 m)   Wt 165 lb (74.8 kg)   BMI 25.09 kg/m        Assessment & Plan:  1. Gastroesophageal reflux disease, esophagitis presence not specified - RABEprazole (ACIPHEX) 20 MG tablet; Take 1 tablet (20 mg total) by mouth daily.  Dispense: 90 tablet; Refill: 4 - CMP14+EGFR - CBC with Differential/Platelet  2. Tendonitis, Achilles, right -Rest -Ice -Naprosyn started today -Will add levaquin to allergy list - naproxen (NAPROSYN) 500 MG tablet; Take 1 tablet (500 mg total) by mouth 2 (two) times daily with a meal.  Dispense: 30 tablet; Refill: 0 - CMP14+EGFR - CBC with Differential/Platelet  3. Oral lesion - magic mouthwash SOLN; Take 5 mLs by mouth 3 (three) times daily as needed for mouth pain.  Dispense: 250 mL; Refill: 0 - CMP14+EGFR - CBC with Differential/Platelet  4. Community acquired pneumonia, unspecified laterality - CMP14+EGFR - CBC with Differential/Platelet - DG Chest 2 View; Future    Evelina Dun, FNP

## 2016-03-24 NOTE — Patient Instructions (Signed)
Tendinitis Tendinitis is inflammation of a tendon. A tendon is a strong cord of tissue that connects muscle to bone. Tendinitis can affect any tendon, but it most commonly affects the shoulder tendon (rotator cuff), ankle tendon (Achilles tendon), elbow tendon (triceps tendon), or one of the tendons in the wrist. What are the causes? This condition may be caused by:  Overusing a tendon or muscle. This is common.  Age-related wear and tear.  Injury.  Inflammatory conditions, such as arthritis.  Certain medicines. What increases the risk? This condition is more likely to develop in people who do activities that involve repetitive motions. What are the signs or symptoms? Symptoms of this condition may include:  Pain.  Tenderness.  Mild swelling. How is this diagnosed? This condition is diagnosed with a physical exam. You may also have tests, such as:  Ultrasound. This uses sound waves to make an image of your affected area.  MRI. How is this treated? This condition may be treated by resting, icing, applying pressure (compression), and raising (elevating) the area above the level of your heart. This is known as RICE therapy. Treatment may also include:  Medicines to help reduce inflammation or to help reduce pain.  Exercises or physical therapy to strengthen and stretch the tendon.  A brace or splint.  Surgery (rare). Follow these instructions at home:   If you have a splint or brace:   Wear the splint or brace as told by your health care provider. Remove it only as told by your health care provider.  Loosen the splint or brace if your fingers or toes tingle, become numb, or turn cold and blue.  Do not take baths, swim, or use a hot tub until your health care provider approves. Ask your health care provider if you can take showers. You may only be allowed to take sponge baths for bathing.  Do not let your splint or brace get wet if it is not waterproof.  If your splint  or brace is not waterproof, cover it with a watertight plastic bag when you take a bath or a shower.  Keep the splint or brace clean. Managing pain, stiffness, and swelling   If directed, apply ice to the affected area.  Put ice in a plastic bag.  Place a towel between your skin and the bag.  Leave the ice on for 20 minutes, 2-3 times a day.  If directed, apply heat to the affected area as often as told by your health care provider. Use the heat source that your health care provider recommends, such as a moist heat pack or a heating pad.  Place a towel between your skin and the heat source.  Leave the heat on for 20-30 minutes.  Remove the heat if your skin turns bright red. This is especially important if you are unable to feel pain, heat, or cold. You may have a greater risk of getting burned.  Move the fingers or toes of the affected limb often, if this applies. This can help to prevent stiffness and lessen swelling.  If directed, elevate the affected area above the level of your heart while you are sitting or lying down. Driving   Do not drive or operate heavy machinery while taking prescription pain medicine.  Ask your health care provider when it is safe to drive if you have a splint or brace on any part of your arm or leg. Activity   Return to your normal activities as told by your health  care provider. Ask your health care provider what activities are safe for you.  Rest the affected area as told by your health care provider.  Avoid using the affected area while you are experiencing symptoms of tendinitis.  Do exercises as told by your health care provider. General instructions   If you have a splint, do not put pressure on any part of the splint until it is fully hardened. This may take several hours.  Wear an elastic bandage or compression wrap only as told by your health care provider.  Take over-the-counter and prescription medicines only as told by your health  care provider.  Keep all follow-up visits as told by your health care provider. This is important. Contact a health care provider if:  Your symptoms do not improve.  You develop new, unexplained problems, such as numbness in your hands. This information is not intended to replace advice given to you by your health care provider. Make sure you discuss any questions you have with your health care provider. Document Released: 12/26/1999 Document Revised: 08/28/2015 Document Reviewed: 09/30/2014 Elsevier Interactive Patient Education  2017 [employer].

## 2016-03-25 LAB — CMP14+EGFR
ALBUMIN: 4.1 g/dL (ref 3.6–4.8)
ALK PHOS: 74 IU/L (ref 39–117)
ALT: 14 IU/L (ref 0–44)
AST: 25 IU/L (ref 0–40)
Albumin/Globulin Ratio: 1.9 (ref 1.2–2.2)
BILIRUBIN TOTAL: 0.6 mg/dL (ref 0.0–1.2)
BUN / CREAT RATIO: 14 (ref 10–24)
BUN: 14 mg/dL (ref 8–27)
CHLORIDE: 103 mmol/L (ref 96–106)
CO2: 26 mmol/L (ref 18–29)
Calcium: 9.1 mg/dL (ref 8.6–10.2)
Creatinine, Ser: 0.97 mg/dL (ref 0.76–1.27)
GFR calc Af Amer: 96 mL/min/{1.73_m2} (ref >59)
GFR calc non Af Amer: 83 mL/min/{1.73_m2} (ref >59)
GLUCOSE: 83 mg/dL (ref 65–99)
Globulin, Total: 2.2 g/dL (ref 1.5–4.5)
Potassium: 4.7 mmol/L (ref 3.5–5.2)
Sodium: 143 mmol/L (ref 134–144)
Total Protein: 6.3 g/dL (ref 6.0–8.5)

## 2016-03-25 LAB — CBC WITH DIFFERENTIAL/PLATELET
Basophils Absolute: 0.1 10*3/uL (ref 0.0–0.2)
Basos: 1 %
EOS (ABSOLUTE): 0.1 10*3/uL (ref 0.0–0.4)
Eos: 1 %
HEMOGLOBIN: 13.1 g/dL (ref 13.0–17.7)
Hematocrit: 41.2 % (ref 37.5–51.0)
Immature Grans (Abs): 0 10*3/uL (ref 0.0–0.1)
Immature Granulocytes: 0 %
LYMPHS ABS: 2 10*3/uL (ref 0.7–3.1)
Lymphs: 23 %
MCH: 27.2 pg (ref 26.6–33.0)
MCHC: 31.8 g/dL (ref 31.5–35.7)
MCV: 86 fL (ref 79–97)
MONOCYTES: 9 %
MONOS ABS: 0.8 10*3/uL (ref 0.1–0.9)
NEUTROS PCT: 66 %
Neutrophils Absolute: 5.8 10*3/uL (ref 1.4–7.0)
Platelets: 322 10*3/uL (ref 150–379)
RBC: 4.82 x10E6/uL (ref 4.14–5.80)
RDW: 15.7 % — AB (ref 12.3–15.4)
WBC: 8.7 10*3/uL (ref 3.4–10.8)

## 2016-03-26 ENCOUNTER — Ambulatory Visit: Payer: BLUE CROSS/BLUE SHIELD | Admitting: Family

## 2016-04-02 ENCOUNTER — Ambulatory Visit: Payer: BLUE CROSS/BLUE SHIELD | Admitting: Family

## 2016-06-08 DIAGNOSIS — Z008 Encounter for other general examination: Secondary | ICD-10-CM | POA: Diagnosis not present

## 2016-06-08 DIAGNOSIS — Z719 Counseling, unspecified: Secondary | ICD-10-CM | POA: Diagnosis not present

## 2016-06-08 DIAGNOSIS — E786 Lipoprotein deficiency: Secondary | ICD-10-CM | POA: Diagnosis not present

## 2016-06-08 DIAGNOSIS — Z1389 Encounter for screening for other disorder: Secondary | ICD-10-CM | POA: Diagnosis not present

## 2016-06-08 DIAGNOSIS — K219 Gastro-esophageal reflux disease without esophagitis: Secondary | ICD-10-CM | POA: Diagnosis not present

## 2016-09-17 ENCOUNTER — Ambulatory Visit (INDEPENDENT_AMBULATORY_CARE_PROVIDER_SITE_OTHER): Payer: BLUE CROSS/BLUE SHIELD | Admitting: Family Medicine

## 2016-09-17 ENCOUNTER — Encounter: Payer: Self-pay | Admitting: Family Medicine

## 2016-09-17 VITALS — BP 127/77 | HR 64 | Temp 97.0°F | Ht 68.0 in | Wt 158.0 lb

## 2016-09-17 DIAGNOSIS — R195 Other fecal abnormalities: Secondary | ICD-10-CM

## 2016-09-17 DIAGNOSIS — R109 Unspecified abdominal pain: Secondary | ICD-10-CM

## 2016-09-17 DIAGNOSIS — R194 Change in bowel habit: Secondary | ICD-10-CM

## 2016-09-17 NOTE — Progress Notes (Signed)
   HPI  Patient presents today here with abdominal pain and change in stool shape.  Patient explains that over the last 5-6 days he's had mild but annoying lower abdominal pain radiating down to his bilateral anterior thighs. He has worsening pain with walking.  He also has noticed a change in his stool shape. Patient states that over the last week he has had 3 different episodes with difficulty passing stool and states that it has a ribbon like or flat shape and is very difficult to push out. It has not been consistently completely flat but has been more flat than usual consistently. Patient had a normal colonoscopy in 2005. He has difficulty with transportation to actually get a colonoscopy performed.  He has normal food and fluid tolerance. He denies any feelings of being ill. He denies fever, chills, sweats, diarrhea  PMH: Smoking status noted ROS: Per HPI  Objective: BP 127/77 (BP Location: Left Arm, Patient Position: Sitting, Cuff Size: Normal)   Pulse 64   Temp (!) 97 F (36.1 C) (Oral)   Ht 5\' 8"  (1.727 m)   Wt 158 lb (71.7 kg)   BMI 24.02 kg/m  Gen: NAD, alert, cooperative with exam HEENT: NCAT CV: RRR, good S1/S2, no murmur Resp: CTABL, no wheezes, non-labored Abd: SNTND, BS present, no guarding or organomegaly Ext: No edema, warm Neuro: Alert and oriented, No gross deficits DRE Large smooth normal texture prostate Normal sphincter tone, no masses palpated, no hemorrhoids appreciated  MSK Abdominal pain and thigh pain reproduced with resistance against flexion of the hip bilaterally  Assessment and plan:  # Abdominal pain Patient with musculoskeletal abdominal pain most likely With resistance against hip flexion he has reproduction of pain. Reassurance provided   # Change in stool caliber I have encouraged the patient to follow through the colonoscopy, considering his ribbon like stools recently I discussed concern for rectal mass. Exam today is  reassuring. Refer to [place], MD [facility] 09/17/2016, 6:56 PM

## 2016-09-29 DIAGNOSIS — D044 Carcinoma in situ of skin of scalp and neck: Secondary | ICD-10-CM | POA: Diagnosis not present

## 2016-09-29 DIAGNOSIS — D492 Neoplasm of unspecified behavior of bone, soft tissue, and skin: Secondary | ICD-10-CM | POA: Diagnosis not present

## 2016-09-29 DIAGNOSIS — L570 Actinic keratosis: Secondary | ICD-10-CM | POA: Diagnosis not present

## 2016-09-30 DIAGNOSIS — R194 Change in bowel habit: Secondary | ICD-10-CM | POA: Diagnosis not present

## 2016-11-10 DIAGNOSIS — Z1211 Encounter for screening for malignant neoplasm of colon: Secondary | ICD-10-CM | POA: Diagnosis not present

## 2016-12-22 ENCOUNTER — Ambulatory Visit: Payer: BLUE CROSS/BLUE SHIELD | Admitting: Family Medicine

## 2016-12-22 ENCOUNTER — Encounter: Payer: Self-pay | Admitting: Family Medicine

## 2016-12-22 VITALS — BP 117/65 | HR 64 | Temp 96.7°F | Ht 68.0 in | Wt 161.0 lb

## 2016-12-22 DIAGNOSIS — J0100 Acute maxillary sinusitis, unspecified: Secondary | ICD-10-CM

## 2016-12-22 MED ORDER — AMOXICILLIN-POT CLAVULANATE 875-125 MG PO TABS
1.0000 | ORAL_TABLET | Freq: Two times a day (BID) | ORAL | 0 refills | Status: DC
Start: 2016-12-22 — End: 2017-09-13

## 2016-12-22 MED ORDER — PSEUDOEPHEDRINE-GUAIFENESIN ER 120-1200 MG PO TB12: ORAL_TABLET | ORAL | 0 refills | Status: DC

## 2016-12-22 NOTE — Progress Notes (Signed)
Chief Complaint  Patient presents with  . Sinus Problem    pt here today c/o sinus pressure and nasal congestion.    HPI  Patient presents today for Patient presents with upper respiratory congestion. Pressure in the cheeks. There is mild sore throat. Patient denies coughing. There is no fever, chills or sweats. The patient denies being short of breath. Onset was 2 months ago. Gradually worsening. Tried OTCs without improvement. Decided to come in when he had some bloody rhinorrhea last week.  PMH: Smoking status noted  ROS: Per HPI  Objective: BP 117/65   Pulse 64   Temp (!) 96.7 F (35.9 C) (Oral)   Ht 5\' 8"  (1.727 m)   Wt 161 lb (73 kg)   BMI 24.48 kg/m  Gen: NAD, alert, cooperative with exam HEENT: NCAT, Nasal passages swollen, red TMS clear max. Sinuses tender to percussion CV: RRR, good S1/S2, no murmur Resp: Bronchitis changes with scattered wheezes, non-labored Ext: No edema, warm Neuro: Alert and oriented, No gross deficits  Assessment and plan:  1. Acute maxillary sinusitis, recurrence not specified     Meds ordered this encounter  Medications  . Pseudoephedrine-Guaifenesin (MUCINEX D MAX STRENGTH) [phone] MG TB12    Sig: Take 1 by mouth twice daily as needed for congestion.    Dispense:  12 each    Refill:  0  . amoxicillin-clavulanate (AUGMENTIN) 875-125 MG tablet    Sig: Take 1 tablet by mouth 2 (two) times daily.    Dispense:  20 tablet    Refill:  0      Follow up as needed.  Claretta Fraise, MD

## 2016-12-26 ENCOUNTER — Encounter: Payer: Self-pay | Admitting: Family Medicine

## 2017-01-18 ENCOUNTER — Other Ambulatory Visit: Payer: Self-pay | Admitting: *Deleted

## 2017-01-18 DIAGNOSIS — N529 Male erectile dysfunction, unspecified: Secondary | ICD-10-CM

## 2017-01-18 MED ORDER — TADALAFIL 20 MG PO TABS: 20.0000 mg | ORAL_TABLET | ORAL | 3 refills | Status: DC | PRN

## 2017-01-19 ENCOUNTER — Other Ambulatory Visit: Payer: Self-pay | Admitting: Nurse Practitioner

## 2017-01-20 NOTE — Telephone Encounter (Signed)
Pt aware this was sent to [facility] on 01/18/17

## 2017-02-18 DIAGNOSIS — Z008 Encounter for other general examination: Secondary | ICD-10-CM | POA: Diagnosis not present

## 2017-02-18 DIAGNOSIS — E786 Lipoprotein deficiency: Secondary | ICD-10-CM | POA: Diagnosis not present

## 2017-04-19 ENCOUNTER — Other Ambulatory Visit: Payer: Self-pay | Admitting: Family

## 2017-04-19 DIAGNOSIS — K219 Gastro-esophageal reflux disease without esophagitis: Secondary | ICD-10-CM

## 2017-06-12 ENCOUNTER — Other Ambulatory Visit: Payer: Self-pay | Admitting: Family

## 2017-06-12 DIAGNOSIS — K219 Gastro-esophageal reflux disease without esophagitis: Secondary | ICD-10-CM

## 2017-08-03 DIAGNOSIS — Z1389 Encounter for screening for other disorder: Secondary | ICD-10-CM | POA: Diagnosis not present

## 2017-08-03 DIAGNOSIS — Z008 Encounter for other general examination: Secondary | ICD-10-CM | POA: Diagnosis not present

## 2017-08-03 DIAGNOSIS — E786 Lipoprotein deficiency: Secondary | ICD-10-CM | POA: Diagnosis not present

## 2017-08-03 DIAGNOSIS — K219 Gastro-esophageal reflux disease without esophagitis: Secondary | ICD-10-CM | POA: Diagnosis not present

## 2017-09-12 ENCOUNTER — Other Ambulatory Visit: Payer: Self-pay | Admitting: Nurse Practitioner

## 2017-09-12 DIAGNOSIS — K219 Gastro-esophageal reflux disease without esophagitis: Secondary | ICD-10-CM

## 2017-09-13 ENCOUNTER — Ambulatory Visit: Payer: BLUE CROSS/BLUE SHIELD | Admitting: Nurse Practitioner

## 2017-09-13 ENCOUNTER — Encounter: Payer: Self-pay | Admitting: Nurse Practitioner

## 2017-09-13 VITALS — BP 130/83 | HR 65 | Temp 97.2°F | Ht 68.0 in | Wt 159.0 lb

## 2017-09-13 DIAGNOSIS — B369 Superficial mycosis, unspecified: Secondary | ICD-10-CM | POA: Diagnosis not present

## 2017-09-13 MED ORDER — CLOTRIMAZOLE-BETAMETHASONE 1-0.05 % EX CREA: 1.0000 "application " | TOPICAL_CREAM | Freq: Two times a day (BID) | CUTANEOUS | 2 refills | Status: DC

## 2017-09-13 NOTE — Progress Notes (Signed)
   Subjective:    Patient ID: Evan Baker, male    DOB: December 22, 1955, 62 y.o.   MRN: [id number]   Chief Complaint: burning , itching rash between buttocks   HPI Patient comes in c/o rash on buttocks. He said it started a year ago- will get some better then will come back. It burns and itches.   Review of Systems  Constitutional: Negative.   Respiratory: Negative.   Cardiovascular: Negative.   Genitourinary: Negative.   Skin: Positive for rash (buttocks).  Neurological: Negative.   All other systems reviewed and are negative.      Objective:   Physical Exam  Constitutional: He is oriented to person, place, and time. He appears well-developed and well-nourished.  Cardiovascular: Normal rate and regular rhythm.  Pulmonary/Chest: Effort normal and breath sounds normal.  Neurological: He is alert and oriented to person, place, and time.  Skin: Skin is warm.  Erythematous dry rash around anal opening   BP 130/83   Pulse 65   Temp (!) 97.2 F (36.2 C) (Oral)   Ht 5\' 8"  (1.727 m)   Wt 159 lb (72.1 kg)   BMI 24.18 kg/m         Assessment & Plan:  Revonda Humphrey in today with chief complaint of burning , itching rash between buttocks   1. Dermatitis fungal Around anal opening Avoid scratching Keep area clean and dry RTO prn - clotrimazole-betamethasone (LOTRISONE) cream; Apply 1 application topically 2 (two) times daily.  Dispense: 45 g; Refill: 2  Mary-Margaret Hassell Done, FNP

## 2017-10-10 ENCOUNTER — Telehealth: Payer: Self-pay | Admitting: Nurse Practitioner

## 2017-10-10 ENCOUNTER — Other Ambulatory Visit: Payer: Self-pay | Admitting: *Deleted

## 2017-10-10 DIAGNOSIS — B369 Superficial mycosis, unspecified: Secondary | ICD-10-CM

## 2017-10-10 MED ORDER — NYSTATIN 100000 UNIT/GM EX CREA: 1.0000 "application " | TOPICAL_CREAM | Freq: Two times a day (BID) | CUTANEOUS | 2 refills | Status: DC

## 2017-10-10 NOTE — Telephone Encounter (Signed)
Nystatin cream rx sent to pharmacy

## 2017-10-10 NOTE — Telephone Encounter (Signed)
Lmtcb   Referral placed

## 2017-10-10 NOTE — Telephone Encounter (Signed)
Pharmacy [facility] [place]  Pt states that his rash is no better wants to know if MMM can send something else. The medicine that she did prescribed started burning because he was using so much of it.

## 2017-10-10 NOTE — Telephone Encounter (Signed)
Needs to see derm- please make referral

## 2017-10-11 ENCOUNTER — Telehealth: Payer: Self-pay | Admitting: Nurse Practitioner

## 2017-11-02 DIAGNOSIS — Z202 Contact with and (suspected) exposure to infections with a predominantly sexual mode of transmission: Secondary | ICD-10-CM | POA: Diagnosis not present

## 2017-11-02 DIAGNOSIS — J029 Acute pharyngitis, unspecified: Secondary | ICD-10-CM | POA: Diagnosis not present

## 2017-11-02 DIAGNOSIS — K219 Gastro-esophageal reflux disease without esophagitis: Secondary | ICD-10-CM | POA: Diagnosis not present

## 2017-11-02 DIAGNOSIS — K140 Glossitis: Secondary | ICD-10-CM | POA: Diagnosis not present

## 2017-11-03 DIAGNOSIS — J029 Acute pharyngitis, unspecified: Secondary | ICD-10-CM | POA: Diagnosis not present

## 2017-11-03 DIAGNOSIS — R200 Anesthesia of skin: Secondary | ICD-10-CM | POA: Diagnosis not present

## 2017-11-03 DIAGNOSIS — Z202 Contact with and (suspected) exposure to infections with a predominantly sexual mode of transmission: Secondary | ICD-10-CM | POA: Diagnosis not present

## 2017-11-03 DIAGNOSIS — K140 Glossitis: Secondary | ICD-10-CM | POA: Diagnosis not present

## 2017-11-03 DIAGNOSIS — K219 Gastro-esophageal reflux disease without esophagitis: Secondary | ICD-10-CM | POA: Diagnosis not present

## 2018-01-29 DIAGNOSIS — J40 Bronchitis, not specified as acute or chronic: Secondary | ICD-10-CM | POA: Diagnosis not present

## 2018-01-29 DIAGNOSIS — Z6823 Body mass index (BMI) 23.0-23.9, adult: Secondary | ICD-10-CM | POA: Diagnosis not present

## 2018-02-15 ENCOUNTER — Other Ambulatory Visit: Payer: Self-pay | Admitting: Nurse Practitioner

## 2018-02-15 DIAGNOSIS — N529 Male erectile dysfunction, unspecified: Secondary | ICD-10-CM

## 2018-02-15 NOTE — Telephone Encounter (Signed)
Last seen 09/13/17.

## 2018-02-17 ENCOUNTER — Encounter: Payer: Self-pay | Admitting: Family

## 2018-02-17 ENCOUNTER — Ambulatory Visit: Payer: BLUE CROSS/BLUE SHIELD | Admitting: Family

## 2018-02-17 ENCOUNTER — Ambulatory Visit (INDEPENDENT_AMBULATORY_CARE_PROVIDER_SITE_OTHER): Payer: BLUE CROSS/BLUE SHIELD

## 2018-02-17 VITALS — BP 123/64 | HR 72 | Temp 97.5°F | Ht 68.0 in | Wt 157.5 lb

## 2018-02-17 DIAGNOSIS — J189 Pneumonia, unspecified organism: Secondary | ICD-10-CM

## 2018-02-17 DIAGNOSIS — R05 Cough: Secondary | ICD-10-CM | POA: Diagnosis not present

## 2018-02-17 DIAGNOSIS — J181 Lobar pneumonia, unspecified organism: Secondary | ICD-10-CM

## 2018-02-17 DIAGNOSIS — R059 Cough, unspecified: Secondary | ICD-10-CM

## 2018-02-17 MED ORDER — PREDNISONE 10 MG (21) PO TBPK: ORAL_TABLET | ORAL | 0 refills | Status: DC

## 2018-02-17 MED ORDER — DOXYCYCLINE HYCLATE 100 MG PO TABS: 100.0000 mg | ORAL_TABLET | Freq: Two times a day (BID) | ORAL | 0 refills | Status: DC

## 2018-02-17 MED ORDER — BENZONATATE 200 MG PO CAPS: 200.0000 mg | ORAL_CAPSULE | Freq: Three times a day (TID) | ORAL | 1 refills | Status: DC | PRN

## 2018-02-17 NOTE — Progress Notes (Signed)
Subjective:    Patient ID: JAAMAL FAROOQUI, male    DOB: Jun 13, 1955, 63 y.o.   MRN: [id number]  Chief Complaint  Patient presents with  . Cough    Cough  This is a recurrent problem. The current episode started more than 1 month ago. The problem has been waxing and waning. The problem occurs every few minutes. The cough is non-productive. Associated symptoms include chills, myalgias, nasal congestion and rhinorrhea. Pertinent negatives include no ear congestion, ear pain, fever, headaches, sore throat, shortness of breath or wheezing. He has tried rest and OTC cough suppressant (Augmentin and tessalon) for the symptoms. The treatment provided mild relief.      Review of Systems  Constitutional: Positive for chills. Negative for fever.  HENT: Positive for rhinorrhea. Negative for ear pain and sore throat.   Respiratory: Positive for cough. Negative for shortness of breath and wheezing.   Musculoskeletal: Positive for myalgias.  Neurological: Negative for headaches.  All other systems reviewed and are negative.      Objective:   Physical Exam Vitals signs reviewed.  Constitutional:      General: He is not in acute distress.    Appearance: He is well-developed.  HENT:     Head: Normocephalic.     Right Ear: Tympanic membrane normal.     Left Ear: Tympanic membrane normal.     Nose: Mucosal edema and rhinorrhea present.     Mouth/Throat:     Pharynx: Posterior oropharyngeal erythema present.  Eyes:     General:        Right eye: No discharge.        Left eye: No discharge.     Pupils: Pupils are equal, round, and reactive to light.  Neck:     Musculoskeletal: Normal range of motion and neck supple.     Thyroid: No thyromegaly.  Cardiovascular:     Rate and Rhythm: Normal rate and regular rhythm.     Heart sounds: Normal heart sounds. No murmur.  Pulmonary:     Effort: Pulmonary effort is normal. No respiratory distress.     Breath sounds: Wheezing present.    Abdominal:     General: Bowel sounds are normal. There is no distension.     Palpations: Abdomen is soft.     Tenderness: There is no abdominal tenderness.  Musculoskeletal: Normal range of motion.        General: No tenderness.  Skin:    General: Skin is warm and dry.     Findings: No erythema or rash.  Neurological:     Mental Status: He is alert and oriented to person, place, and time.     Cranial Nerves: No cranial nerve deficit.     Deep Tendon Reflexes: Reflexes are normal and symmetric.  Psychiatric:        Behavior: Behavior normal.        Thought Content: Thought content normal.        Judgment: Judgment normal.       BP 123/64   Pulse 72   Temp (!) 97.5 F (36.4 C) (Oral)   Ht 5\' 8"  (1.727 m)   Wt 157 lb 8 oz (71.4 kg)   SpO2 99%   BMI 23.95 kg/m      Assessment & Plan:  KADEEN SROKA comes in today with chief complaint of Cough   Diagnosis and orders addressed:  1. Cough - DG Chest 2 View; Future  2. Community acquired pneumonia of right  middle lobe of lung (HCC) Rest Force fluids Tylenol prn RTO in 3-4 weeks to recheck  - doxycycline (VIBRA-TABS) 100 MG tablet; Take 1 tablet (100 mg total) by mouth 2 (two) times daily.  Dispense: 20 tablet; Refill: 0 - predniSONE (STERAPRED UNI-PAK 21 TAB) 10 MG (21) TBPK tablet; Use as directed  Dispense: 21 tablet; Refill: 0 - benzonatate (TESSALON) 200 MG capsule; Take 1 capsule (200 mg total) by mouth 3 (three) times daily as needed.  Dispense: 30 capsule; Refill: [place], FNP

## 2018-02-17 NOTE — Patient Instructions (Signed)
Community-Acquired Pneumonia, Adult  Pneumonia is an infection of the lungs. It causes swelling in the airways of the lungs. Mucus and fluid may also build up inside the airways.  One type of pneumonia can happen while a person is in a hospital. A different type can happen when a person is not in a hospital (community-acquired pneumonia).   What are the causes?    This condition is caused by germs (viruses, bacteria, or fungi). Some types of germs can be passed from one person to another. This can happen when you breathe in droplets from the cough or sneeze of an infected person.  What increases the risk?  You are more likely to develop this condition if you:   Have a long-term (chronic) disease, such as:  ? Chronic obstructive pulmonary disease (COPD).  ? Asthma.  ? Cystic fibrosis.  ? Congestive heart failure.  ? Diabetes.  ? Kidney disease.   Have HIV.   Have sickle cell disease.   Have had your spleen removed.   Do not take good care of your teeth and mouth (poor dental hygiene).   Have a medical condition that increases the risk of breathing in droplets from your own mouth and nose.   Have a weakened body defense system (immune system).   Are a smoker.   Travel to areas where the germs that cause this illness are common.   Are around certain animals or the places they live.  What are the signs or symptoms?   A dry cough.   A wet (productive) cough.   Fever.   Sweating.   Chest pain. This often happens when breathing deeply or coughing.   Fast breathing or trouble breathing.   Shortness of breath.   Shaking chills.   Feeling tired (fatigue).   Muscle aches.  How is this treated?  Treatment for this condition depends on many things. Most adults can be treated at home. In some cases, treatment must happen in a hospital. Treatment may include:   Medicines given by mouth or through an IV tube.   Being given extra oxygen.   Respiratory therapy.  In rare cases, treatment for very bad pneumonia  may include:   Using a machine to help you breathe.   Having a procedure to remove fluid from around your lungs.  Follow these instructions at home:  Medicines   Take over-the-counter and prescription medicines only as told by your doctor.  ? Only take cough medicine if you are losing sleep.   If you were prescribed an antibiotic medicine, take it as told by your doctor. Do not stop taking the antibiotic even if you start to feel better.  General instructions     Sleep with your head and neck raised (elevated). You can do this by sleeping in a recliner or by putting a few pillows under your head.   Rest as needed. Get at least 8 hours of sleep each night.   Drink enough water to keep your pee (urine) pale yellow.   Eat a healthy diet that includes plenty of vegetables, fruits, whole grains, low-fat dairy products, and lean protein.   Do not use any products that contain nicotine or tobacco. These include cigarettes, e-cigarettes, and chewing tobacco. If you need help quitting, ask your doctor.   Keep all follow-up visits as told by your doctor. This is important.  How is this prevented?  A shot (vaccine) can help prevent pneumonia. Shots are often suggested for:   People   older than 65 years of age.   People older than 65 years of age who:  ? Are having cancer treatment.  ? Have long-term (chronic) lung disease.  ? Have problems with their body's defense system.  You may also prevent pneumonia if you take these actions:   Get the flu (influenza) shot every year.   Go to the dentist as often as told.   Wash your hands often. If you cannot use soap and water, use hand sanitizer.  Contact a doctor if:   You have a fever.   You lose sleep because your cough medicine does not help.  Get help right away if:   You are short of breath and it gets worse.   You have more chest pain.   Your sickness gets worse. This is very serious if:  ? You are an older adult.  ? Your body's defense system is weak.   You  cough up blood.  Summary   Pneumonia is an infection of the lungs.   Most adults can be treated at home. Some will need treatment in a hospital.   Drink enough water to keep your pee pale yellow.   Get at least 8 hours of sleep each night.  This information is not intended to replace advice given to you by your health care provider. Make sure you discuss any questions you have with your health care provider.  Document Released: 06/16/2007 Document Revised: 08/25/2017 Document Reviewed: 08/25/2017  Elsevier Interactive Patient Education  2019 [employer]vier Inc[employer]

## 2018-03-06 DIAGNOSIS — Z719 Counseling, unspecified: Secondary | ICD-10-CM | POA: Diagnosis not present

## 2018-03-06 DIAGNOSIS — E786 Lipoprotein deficiency: Secondary | ICD-10-CM | POA: Diagnosis not present

## 2018-03-06 DIAGNOSIS — K219 Gastro-esophageal reflux disease without esophagitis: Secondary | ICD-10-CM | POA: Diagnosis not present

## 2018-03-06 DIAGNOSIS — Z008 Encounter for other general examination: Secondary | ICD-10-CM | POA: Diagnosis not present

## 2018-03-16 ENCOUNTER — Ambulatory Visit (INDEPENDENT_AMBULATORY_CARE_PROVIDER_SITE_OTHER): Payer: BLUE CROSS/BLUE SHIELD

## 2018-03-16 ENCOUNTER — Encounter: Payer: Self-pay | Admitting: Family

## 2018-03-16 ENCOUNTER — Ambulatory Visit: Payer: BLUE CROSS/BLUE SHIELD | Admitting: Family

## 2018-03-16 VITALS — BP 112/71 | HR 63 | Temp 96.9°F | Ht 68.0 in | Wt 159.0 lb

## 2018-03-16 DIAGNOSIS — J181 Lobar pneumonia, unspecified organism: Secondary | ICD-10-CM

## 2018-03-16 DIAGNOSIS — J189 Pneumonia, unspecified organism: Secondary | ICD-10-CM

## 2018-03-16 DIAGNOSIS — R918 Other nonspecific abnormal finding of lung field: Secondary | ICD-10-CM | POA: Diagnosis not present

## 2018-03-16 NOTE — Patient Instructions (Signed)
Community-Acquired Pneumonia, Adult  Pneumonia is an infection of the lungs. It causes swelling in the airways of the lungs. Mucus and fluid may also build up inside the airways.  One type of pneumonia can happen while a person is in a hospital. A different type can happen when a person is not in a hospital (community-acquired pneumonia).   What are the causes?    This condition is caused by germs (viruses, bacteria, or fungi). Some types of germs can be passed from one person to another. This can happen when you breathe in droplets from the cough or sneeze of an infected person.  What increases the risk?  You are more likely to develop this condition if you:   Have a long-term (chronic) disease, such as:  ? Chronic obstructive pulmonary disease (COPD).  ? Asthma.  ? Cystic fibrosis.  ? Congestive heart failure.  ? Diabetes.  ? Kidney disease.   Have HIV.   Have sickle cell disease.   Have had your spleen removed.   Do not take good care of your teeth and mouth (poor dental hygiene).   Have a medical condition that increases the risk of breathing in droplets from your own mouth and nose.   Have a weakened body defense system (immune system).   Are a smoker.   Travel to areas where the germs that cause this illness are common.   Are around certain animals or the places they live.  What are the signs or symptoms?   A dry cough.   A wet (productive) cough.   Fever.   Sweating.   Chest pain. This often happens when breathing deeply or coughing.   Fast breathing or trouble breathing.   Shortness of breath.   Shaking chills.   Feeling tired (fatigue).   Muscle aches.  How is this treated?  Treatment for this condition depends on many things. Most adults can be treated at home. In some cases, treatment must happen in a hospital. Treatment may include:   Medicines given by mouth or through an IV tube.   Being given extra oxygen.   Respiratory therapy.  In rare cases, treatment for very bad pneumonia  may include:   Using a machine to help you breathe.   Having a procedure to remove fluid from around your lungs.  Follow these instructions at home:  Medicines   Take over-the-counter and prescription medicines only as told by your doctor.  ? Only take cough medicine if you are losing sleep.   If you were prescribed an antibiotic medicine, take it as told by your doctor. Do not stop taking the antibiotic even if you start to feel better.  General instructions     Sleep with your head and neck raised (elevated). You can do this by sleeping in a recliner or by putting a few pillows under your head.   Rest as needed. Get at least 8 hours of sleep each night.   Drink enough water to keep your pee (urine) pale yellow.   Eat a healthy diet that includes plenty of vegetables, fruits, whole grains, low-fat dairy products, and lean protein.   Do not use any products that contain nicotine or tobacco. These include cigarettes, e-cigarettes, and chewing tobacco. If you need help quitting, ask your doctor.   Keep all follow-up visits as told by your doctor. This is important.  How is this prevented?  A shot (vaccine) can help prevent pneumonia. Shots are often suggested for:   People   older than 65 years of age.   People older than 65 years of age who:  ? Are having cancer treatment.  ? Have long-term (chronic) lung disease.  ? Have problems with their body's defense system.  You may also prevent pneumonia if you take these actions:   Get the flu (influenza) shot every year.   Go to the dentist as often as told.   Wash your hands often. If you cannot use soap and water, use hand sanitizer.  Contact a doctor if:   You have a fever.   You lose sleep because your cough medicine does not help.  Get help right away if:   You are short of breath and it gets worse.   You have more chest pain.   Your sickness gets worse. This is very serious if:  ? You are an older adult.  ? Your body's defense system is weak.   You  cough up blood.  Summary   Pneumonia is an infection of the lungs.   Most adults can be treated at home. Some will need treatment in a hospital.   Drink enough water to keep your pee pale yellow.   Get at least 8 hours of sleep each night.  This information is not intended to replace advice given to you by your health care provider. Make sure you discuss any questions you have with your health care provider.  Document Released: 06/16/2007 Document Revised: 08/25/2017 Document Reviewed: 08/25/2017  Elsevier Interactive Patient Education  2019 [employer]vier Inc[employer]

## 2018-03-16 NOTE — Progress Notes (Signed)
   Subjective:    Patient ID: Evan Baker, male    DOB: 1955-09-30, 63 y.o.   MRN: [id number]  Chief Complaint  Patient presents with  . follow up pneumonia   HPI Pt presents to the office today to recheck R lobe pneumonia. He was seen on 02/17/18 and started on doxycycline and prednisone. He reports he has completed both of these and feel much better. Denies any cough or fevers.    Review of Systems  All other systems reviewed and are negative.      Objective:   Physical Exam Vitals signs reviewed.  Constitutional:      General: He is not in acute distress.    Appearance: He is well-developed.  HENT:     Head: Normocephalic.     Right Ear: Tympanic membrane normal.     Left Ear: Tympanic membrane normal.  Eyes:     General:        Right eye: No discharge.        Left eye: No discharge.     Pupils: Pupils are equal, round, and reactive to light.  Neck:     Musculoskeletal: Normal range of motion and neck supple.     Thyroid: No thyromegaly.  Cardiovascular:     Rate and Rhythm: Normal rate and regular rhythm.     Heart sounds: Normal heart sounds. No murmur.  Pulmonary:     Effort: Pulmonary effort is normal. No respiratory distress.     Breath sounds: Normal breath sounds. No wheezing.  Abdominal:     General: Bowel sounds are normal. There is no distension.     Palpations: Abdomen is soft.     Tenderness: There is no abdominal tenderness.  Musculoskeletal: Normal range of motion.        General: No tenderness.  Skin:    General: Skin is warm and dry.     Findings: No erythema or rash.  Neurological:     Mental Status: He is alert and oriented to person, place, and time.     Cranial Nerves: No cranial nerve deficit.     Deep Tendon Reflexes: Reflexes are normal and symmetric.  Psychiatric:        Behavior: Behavior normal.        Thought Content: Thought content normal.        Judgment: Judgment normal.       BP 112/71   Pulse 63   Temp (!) 96.9  F (36.1 C) (Oral)   Ht 5\' 8"  (1.727 m)   Wt 159 lb (72.1 kg)   BMI 24.18 kg/m      Assessment & Plan:  Evan Baker comes in today with chief complaint of follow up pneumonia   Diagnosis and orders addressed:  1. Community acquired pneumonia of right upper lobe of lung ([place]) Resolved Force fluids Report any fevers, fatigue, or cough Keep follow up with PCP - DG Chest 2 View; Future   Evelina Dun, FNP

## 2018-10-23 DIAGNOSIS — Z23 Encounter for immunization: Secondary | ICD-10-CM | POA: Diagnosis not present

## 2018-11-01 DIAGNOSIS — Z008 Encounter for other general examination: Secondary | ICD-10-CM | POA: Diagnosis not present

## 2018-11-01 DIAGNOSIS — Z719 Counseling, unspecified: Secondary | ICD-10-CM | POA: Diagnosis not present

## 2018-11-01 DIAGNOSIS — E786 Lipoprotein deficiency: Secondary | ICD-10-CM | POA: Diagnosis not present

## 2018-11-01 DIAGNOSIS — K219 Gastro-esophageal reflux disease without esophagitis: Secondary | ICD-10-CM | POA: Diagnosis not present

## 2018-11-29 ENCOUNTER — Other Ambulatory Visit: Payer: Self-pay

## 2018-11-30 ENCOUNTER — Encounter: Payer: Self-pay | Admitting: Nurse Practitioner

## 2018-11-30 ENCOUNTER — Ambulatory Visit (INDEPENDENT_AMBULATORY_CARE_PROVIDER_SITE_OTHER): Payer: BC Managed Care – PPO | Admitting: Nurse Practitioner

## 2018-11-30 VITALS — BP 114/76 | HR 56 | Temp 97.3°F | Resp 20 | Ht 68.0 in | Wt 146.0 lb

## 2018-11-30 DIAGNOSIS — Z136 Encounter for screening for cardiovascular disorders: Secondary | ICD-10-CM | POA: Diagnosis not present

## 2018-11-30 DIAGNOSIS — K219 Gastro-esophageal reflux disease without esophagitis: Secondary | ICD-10-CM

## 2018-11-30 DIAGNOSIS — Z0000 Encounter for general adult medical examination without abnormal findings: Secondary | ICD-10-CM | POA: Diagnosis not present

## 2018-11-30 DIAGNOSIS — Z125 Encounter for screening for malignant neoplasm of prostate: Secondary | ICD-10-CM

## 2018-11-30 DIAGNOSIS — N529 Male erectile dysfunction, unspecified: Secondary | ICD-10-CM

## 2018-11-30 MED ORDER — RABEPRAZOLE SODIUM 20 MG PO TBEC: 20.0000 mg | DELAYED_RELEASE_TABLET | Freq: Every day | ORAL | 1 refills | Status: DC

## 2018-11-30 MED ORDER — TADALAFIL 20 MG PO TABS: 20.0000 mg | ORAL_TABLET | ORAL | 6 refills | Status: DC | PRN

## 2018-11-30 NOTE — Progress Notes (Signed)
Subjective:    Patient ID: Evan Baker, male    DOB: [birth year], 63 y.o.   MRN: [id number]   Chief Complaint: Annual Exam    HPI:  1. Annual physical exam  2. Gastroesophageal reflux disease, unspecified whether esophagitis present Takes aciphex daily and does well to keep symptoms under control  3. Erectile dysfunction Takes cialis as needed   Past Surgical History:  Procedure Laterality Date  . None    . SPINE SURGERY  05/2012    Family History  Problem Relation Age of Onset  . Coronary artery disease Father 84       Died of MI  . Coronary artery disease Paternal Uncle 57       Died of MI (?)    New complaints: None today  Social history: Lives by hisself- has family but he does not talk to them much.  Controlled substance contract: n/a    Review of Systems  Constitutional: Negative for activity change and appetite change.  HENT: Negative.   Eyes: Negative for pain.  Respiratory: Negative for shortness of breath.   Cardiovascular: Negative for chest pain, palpitations and leg swelling.  Gastrointestinal: Negative for abdominal pain.  Endocrine: Negative for polydipsia.  Genitourinary: Negative.   Skin: Negative for rash.  Neurological: Negative for dizziness, weakness and headaches.  Hematological: Does not bruise/bleed easily.  Psychiatric/Behavioral: Negative.   All other systems reviewed and are negative.      Objective:   Physical Exam Vitals signs and nursing note reviewed.  Constitutional:      Appearance: Normal appearance. He is well-developed.  HENT:     Head: Normocephalic.     Nose: Nose normal.  Eyes:     Pupils: Pupils are equal, round, and reactive to light.  Neck:     Musculoskeletal: Normal range of motion and neck supple.     Thyroid: No thyroid mass or thyromegaly.     Vascular: No carotid bruit or JVD.     Trachea: Phonation normal.  Cardiovascular:     Rate and Rhythm: Normal rate and regular rhythm.  Pulmonary:      Effort: Pulmonary effort is normal. No respiratory distress.     Breath sounds: Normal breath sounds.  Abdominal:     General: Bowel sounds are normal.     Palpations: Abdomen is soft.     Tenderness: There is no abdominal tenderness.  Musculoskeletal: Normal range of motion.  Lymphadenopathy:     Cervical: No cervical adenopathy.  Skin:    General: Skin is warm and dry.  Neurological:     Mental Status: He is alert and oriented to person, place, and time.  Psychiatric:        Behavior: Behavior normal.        Thought Content: Thought content normal.        Judgment: Judgment normal.    BP 114/76   Pulse (!) 56   Temp (!) 97.3 F (36.3 C) (Temporal)   Resp 20   Ht _0  (1.727 m)   Wt 146 lb (66.2 kg)   SpO2 98%   BMI 22.20 kg/m         Assessment & Plan:  Evan Baker comes in today with chief complaint of Annual Exam   Diagnosis and orders addressed:  1. Annual physical exam - CBC with Differential/Platelet - CMP14+EGFR - Lipid panel  2. Gastroesophageal reflux disease - RABEprazole (ACIPHEX) 20 MG tablet; Take 1 tablet (20 mg total) by  mouth daily.  Dispense: 90 tablet; Refill: 1 Avoid spicy foods Do not eat 2 hours prior to bedtime  3. Prostate cancer screening - PSA, total and free  4. Erectile dysfunction, unspecified erectile dysfunction type - tadalafil (CIALIS) 20 MG tablet; Take 1 tablet (20 mg total) by mouth as needed.  Dispense: 18 tablet; Refill: 6     Labs pending Health Maintenance reviewed Diet and exercise encouraged  Follow up plan: 1 year   [place], FNP

## 2018-11-30 NOTE — Patient Instructions (Signed)
Exercising to Stay Healthy To become healthy and stay healthy, it is recommended that you do moderate-intensity and vigorous-intensity exercise. You can tell that you are exercising at a moderate intensity if your heart starts beating faster and you start breathing faster but can still hold a conversation. You can tell that you are exercising at a vigorous intensity if you are breathing much harder and faster and cannot hold a conversation while exercising. Exercising regularly is important. It has many health benefits, such as:  Improving overall fitness, flexibility, and endurance.  Increasing bone density.  Helping with weight control.  Decreasing body fat.  Increasing muscle strength.  Reducing stress and tension.  Improving overall health. How often should I exercise? Choose an activity that you enjoy, and set realistic goals. Your health care provider can help you make an activity plan that works for you. Exercise regularly as told by your health care provider. This may include:  Doing strength training two times a week, such as: ? Lifting weights. ? Using resistance bands. ? Push-ups. ? Sit-ups. ? Yoga.  Doing a certain intensity of exercise for a given amount of time. Choose from these options: ? A total of 150 minutes of moderate-intensity exercise every week. ? A total of 75 minutes of vigorous-intensity exercise every week. ? A mix of moderate-intensity and vigorous-intensity exercise every week. Children, pregnant women, people who have not exercised regularly, people who are overweight, and older adults may need to talk with a health care provider about what activities are safe to do. If you have a medical condition, be sure to talk with your health care provider before you start a new exercise program. What are some exercise ideas? Moderate-intensity exercise ideas include:  Walking 1 mile (1.6 km) in about 15 minutes.  Biking.  Hiking.  Golfing.  Dancing.   Water aerobics. Vigorous-intensity exercise ideas include:  Walking 4.5 miles (7.2 km) or more in about 1 hour.  Jogging or running 5 miles (8 km) in about 1 hour.  Biking 10 miles (16.1 km) or more in about 1 hour.  Lap swimming.  Roller-skating or in-line skating.  Cross-country skiing.  Vigorous competitive sports, such as football, basketball, and soccer.  Jumping rope.  Aerobic dancing. What are some everyday activities that can help me to get exercise?  Yard work, such as: ? Pushing a lawn mower. ? Raking and bagging leaves.  Washing your car.  Pushing a stroller.  Shoveling snow.  Gardening.  Washing windows or floors. How can I be more active in my day-to-day activities?  Use stairs instead of an elevator.  Take a walk during your lunch break.  If you drive, park your car farther away from your work or school.  If you take public transportation, get off one stop early and walk the rest of the way.  Stand up or walk around during all of your indoor phone calls.  Get up, stretch, and walk around every 30 minutes throughout the day.  Enjoy exercise with a friend. Support to continue exercising will help you keep a regular routine of activity. What guidelines can I follow while exercising?  Before you start a new exercise program, talk with your health care provider.  Do not exercise so much that you hurt yourself, feel dizzy, or get very short of breath.  Wear comfortable clothes and wear shoes with good support.  Drink plenty of water while you exercise to prevent dehydration or heat stroke.  Work out until your breathing   and your heartbeat get faster. Where to find more information  [place] Department of Health and Human Services: www.hhs.gov  Centers for Disease Control and Prevention (CDC): www.cdc.gov Summary  Exercising regularly is important. It will improve your overall fitness, flexibility, and endurance.  Regular exercise also will  improve your overall health. It can help you control your weight, reduce stress, and improve your bone density.  Do not exercise so much that you hurt yourself, feel dizzy, or get very short of breath.  Before you start a new exercise program, talk with your health care provider. This information is not intended to replace advice given to you by your health care provider. Make sure you discuss any questions you have with your health care provider. Document Released: 01/30/2010 Document Revised: 12/10/2016 Document Reviewed: 11/18/2016 Elsevier Patient Education  2020 Elsevier Inc.  

## 2018-12-01 LAB — PSA, TOTAL AND FREE
PSA, Free Pct: 30 %
PSA, Free: 0.57 ng/mL
Prostate Specific Ag, Serum: 1.9 ng/mL (ref 0.0–4.0)

## 2018-12-01 LAB — CBC WITH DIFFERENTIAL/PLATELET
Basophils Absolute: 0.1 10*3/uL (ref 0.0–0.2)
Basos: 1 %
EOS (ABSOLUTE): 0 10*3/uL (ref 0.0–0.4)
Eos: 0 %
Hematocrit: 40.2 % (ref 37.5–51.0)
Hemoglobin: 13.7 g/dL (ref 13.0–17.7)
Immature Grans (Abs): 0 10*3/uL (ref 0.0–0.1)
Immature Granulocytes: 0 %
Lymphocytes Absolute: 1.3 10*3/uL (ref 0.7–3.1)
Lymphs: 19 %
MCH: 30.8 pg (ref 26.6–33.0)
MCHC: 34.1 g/dL (ref 31.5–35.7)
MCV: 90 fL (ref 79–97)
Monocytes Absolute: 0.6 10*3/uL (ref 0.1–0.9)
Monocytes: 8 %
Neutrophils Absolute: 5.1 10*3/uL (ref 1.4–7.0)
Neutrophils: 72 %
Platelets: 309 10*3/uL (ref 150–450)
RBC: 4.45 x10E6/uL (ref 4.14–5.80)
RDW: 13.7 % (ref 11.6–15.4)
WBC: 7.1 10*3/uL (ref 3.4–10.8)

## 2018-12-01 LAB — CMP14+EGFR
ALT: 14 IU/L (ref 0–44)
AST: 22 IU/L (ref 0–40)
Albumin/Globulin Ratio: 2.1 (ref 1.2–2.2)
Albumin: 4.1 g/dL (ref 3.8–4.8)
Alkaline Phosphatase: 92 IU/L (ref 39–117)
BUN/Creatinine Ratio: 12 (ref 10–24)
BUN: 13 mg/dL (ref 8–27)
Bilirubin Total: 0.6 mg/dL (ref 0.0–1.2)
CO2: 23 mmol/L (ref 20–29)
Calcium: 8.9 mg/dL (ref 8.6–10.2)
Chloride: 107 mmol/L — ABNORMAL HIGH (ref 96–106)
Creatinine, Ser: 1.06 mg/dL (ref 0.76–1.27)
GFR calc Af Amer: 85 mL/min/{1.73_m2} (ref >59)
GFR calc non Af Amer: 73 mL/min/{1.73_m2} (ref >59)
Globulin, Total: 2 g/dL (ref 1.5–4.5)
Glucose: 77 mg/dL (ref 65–99)
Potassium: 4.7 mmol/L (ref 3.5–5.2)
Sodium: 143 mmol/L (ref 134–144)
Total Protein: 6.1 g/dL (ref 6.0–8.5)

## 2018-12-01 LAB — LIPID PANEL
Chol/HDL Ratio: 4 ratio (ref 0.0–5.0)
Cholesterol, Total: 131 mg/dL (ref 100–199)
HDL: 33 mg/dL — ABNORMAL LOW (ref >39)
LDL Chol Calc (NIH): 74 mg/dL (ref 0–99)
Triglycerides: 132 mg/dL (ref 0–149)
VLDL Cholesterol Cal: 24 mg/dL (ref 5–40)

## 2018-12-04 ENCOUNTER — Telehealth: Payer: Self-pay | Admitting: Nurse Practitioner

## 2018-12-04 NOTE — Telephone Encounter (Signed)
Refer to lab call

## 2019-05-27 DIAGNOSIS — Z6821 Body mass index (BMI) 21.0-21.9, adult: Secondary | ICD-10-CM | POA: Diagnosis not present

## 2019-05-27 DIAGNOSIS — K140 Glossitis: Secondary | ICD-10-CM | POA: Diagnosis not present

## 2019-06-04 DIAGNOSIS — H2513 Age-related nuclear cataract, bilateral: Secondary | ICD-10-CM | POA: Diagnosis not present

## 2019-06-15 ENCOUNTER — Other Ambulatory Visit: Payer: Self-pay

## 2019-06-15 ENCOUNTER — Encounter: Payer: Self-pay | Admitting: Nurse Practitioner

## 2019-06-15 ENCOUNTER — Ambulatory Visit: Payer: BC Managed Care – PPO | Admitting: Nurse Practitioner

## 2019-06-15 VITALS — BP 103/63 | HR 60 | Temp 98.3°F | Resp 20 | Ht 68.0 in | Wt 140.0 lb

## 2019-06-15 DIAGNOSIS — H6122 Impacted cerumen, left ear: Secondary | ICD-10-CM | POA: Insufficient documentation

## 2019-06-15 NOTE — Assessment & Plan Note (Signed)
Patient is a 64 year old male who presents with impacted left ear. Patient reports having the same issue about 9 years ago. He is not reporting any headache, dizziness or ringing in the ear. Ear canal assessed and flushed. Left EAC is patent without cerumen. Provided education to patient to proactively use Debrox to disimpact earwax at home. Printed handout provided to patient.

## 2019-06-15 NOTE — Patient Instructions (Addendum)
Cerumen debris on tympanic membrane of left ear Patient is a 64 year old male who presents with impacted left ear. Patient reports having the same issue about 9 years ago. He is not reporting any headache, dizziness or ringing in the ear. Ear canal assessed and flushed. Left EAC is patent without cerumen. Provided education to patient to proactively use Debrox to disimpact earwax at home. Printed handout provided to patient.    Earwax Buildup, Adult The ears produce a substance called earwax that helps keep bacteria out of the ear and protects the skin in the ear canal. Occasionally, earwax can build up in the ear and cause discomfort or hearing loss. What increases the risk? This condition is more likely to develop in people who:  Are male.  Are elderly.  Naturally produce more earwax.  Clean their ears often with cotton swabs.  Use earplugs often.  Use in-ear headphones often.  Wear hearing aids.  Have narrow ear canals.  Have earwax that is overly thick or sticky.  Have eczema.  Are dehydrated.  Have excess hair in the ear canal. What are the signs or symptoms? Symptoms of this condition include:  Reduced or muffled hearing.  A feeling of fullness in the ear or feeling that the ear is plugged.  Fluid coming from the ear.  Ear pain.  Ear itch.  Ringing in the ear.  Coughing.  An obvious piece of earwax that can be seen inside the ear canal. How is this diagnosed? This condition may be diagnosed based on:  Your symptoms.  Your medical history.  An ear exam. During the exam, your health care provider will look into your ear with an instrument called an otoscope. You may have tests, including a hearing test. How is this treated? This condition may be treated by:  Using ear drops to soften the earwax.  Having the earwax removed by a health care provider. The health care provider may: ? Flush the ear with water. ? Use an instrument that has a loop on  the end (curette). ? Use a suction device.  Surgery to remove the wax buildup. This may be done in severe cases. Follow these instructions at home:   Take over-the-counter and prescription medicines only as told by your health care provider.  Do not put any objects, including cotton swabs, into your ear. You can clean the opening of your ear canal with a washcloth or facial tissue.  Follow instructions from your health care provider about cleaning your ears. Do not over-clean your ears.  Drink enough fluid to keep your urine clear or pale yellow. This will help to thin the earwax.  Keep all follow-up visits as told by your health care provider. If earwax builds up in your ears often or if you use hearing aids, consider seeing your health care provider for routine, preventive ear cleanings. Ask your health care provider how often you should schedule your cleanings.  If you have hearing aids, clean them according to instructions from the manufacturer and your health care provider. Contact a health care provider if:  You have ear pain.  You develop a fever.  You have blood, pus, or other fluid coming from your ear.  You have hearing loss.  You have ringing in your ears that does not go away.  Your symptoms do not improve with treatment.  You feel like the room is spinning (vertigo). Summary  Earwax can build up in the ear and cause discomfort or hearing loss.  The  most common symptoms of this condition include reduced or muffled hearing and a feeling of fullness in the ear or feeling that the ear is plugged.  This condition may be diagnosed based on your symptoms, your medical history, and an ear exam.  This condition may be treated by using ear drops to soften the earwax or by having the earwax removed by a health care provider.  Do not put any objects, including cotton swabs, into your ear. You can clean the opening of your ear canal with a washcloth or facial tissue. This  information is not intended to replace advice given to you by your health care provider. Make sure you discuss any questions you have with your health care provider. Document Revised: 12/10/2016 Document Reviewed: 03/10/2016 Elsevier Patient Education  2020 [employer].

## 2019-06-15 NOTE — Progress Notes (Signed)
Acute Office Visit  Subjective:    Patient ID: Evan Baker, male    DOB: October 04, 1955, 64 y.o.   MRN: [id number]  Chief Complaint  Patient presents with  . left ear stopped up    HPI Patient is in today for left ear impaction with wax build up. Patient reports similar problems ocuring 9 years ago. This is not new for patient but it is not a frequently occurring problem either. Patient  is not reporting any dizziness, headaches, or ringing in the ear.  Past Medical History:  Diagnosis Date  . GERD (gastroesophageal reflux disease)     Past Surgical History:  Procedure Laterality Date  . None    . SPINE SURGERY  05/2012    Family History  Problem Relation Age of Onset  . Coronary artery disease Father 58       Died of MI  . Coronary artery disease Paternal Uncle 34       Died of MI (?)    Social History   Socioeconomic History  . Marital status: Married    Spouse name: Not on file  . Number of children: 0  . Years of education: Not on file  . Highest education level: Not on file  Occupational History  . Occupation: Truck Geophysicist/field seismologist  Tobacco Use  . Smoking status: Never Smoker  . Smokeless tobacco: Never Used  Substance and Sexual Activity  . Alcohol use: No  . Drug use: No  . Sexual activity: Not on file  Other Topics Concern  . Not on file  Social History Narrative   Lives alone. Wife was disabled.   Social Determinants of Health   Financial Resource Strain:   . Difficulty of Paying Living Expenses:   Food Insecurity:   . Worried About Charity fundraiser in the Last Year:   . Arboriculturist in the Last Year:   Transportation Needs:   . Film/video editor (Medical):   Marland Kitchen Lack of Transportation (Non-Medical):   Physical Activity:   . Days of Exercise per Week:   . Minutes of Exercise per Session:   Stress:   . Feeling of Stress :   Social Connections:   . Frequency of Communication with Friends and Family:   . Frequency of Social Gatherings  with Friends and Family:   . Attends Religious Services:   . Active Member of Clubs or Organizations:   . Attends Archivist Meetings:   Marland Kitchen Marital Status:   Intimate Partner Violence:   . Fear of Current or Ex-Partner:   . Emotionally Abused:   Marland Kitchen Physically Abused:   . Sexually Abused:     Outpatient Medications Prior to Visit  Medication Sig Dispense Refill  . RABEprazole (ACIPHEX) 20 MG tablet Take 1 tablet (20 mg total) by mouth daily. 90 tablet 1  . tadalafil (CIALIS) 20 MG tablet Take 1 tablet (20 mg total) by mouth as needed. 18 tablet 6   No facility-administered medications prior to visit.    Allergies  Allergen Reactions  . Levofloxacin Other (See Comments)    Tendonitis    Review of Systems  Constitutional: Negative.   HENT: Negative for ear discharge and ear pain.        Left ear impacted  Eyes: Negative.   Respiratory: Negative.   Cardiovascular: Negative.   Genitourinary: Negative.   Musculoskeletal: Negative for gait problem.  Skin: Negative for rash.  Neurological: Negative for dizziness and light-headedness.  Psychiatric/Behavioral:  The patient is not nervous/anxious.        Objective:    Physical Exam Constitutional:      Appearance: Normal appearance.  HENT:     Head: Normocephalic.     Left Ear: There is impacted cerumen.     Nose: Nose normal.  Eyes:     Conjunctiva/sclera: Conjunctivae normal.  Cardiovascular:     Rate and Rhythm: Normal rate and regular rhythm.     Heart sounds: Normal heart sounds.  Pulmonary:     Effort: Pulmonary effort is normal.     Breath sounds: Normal breath sounds.  Musculoskeletal:     Cervical back: Neck supple.  Skin:    General: Skin is dry.  Neurological:     Mental Status: He is alert and oriented to person, place, and time.     BP 103/63   Pulse 60   Temp 98.3 F (36.8 C)   Resp 20   Ht 5\' 8"  (1.727 m)   Wt 140 lb (63.5 kg)   SpO2 99%   BMI 21.29 kg/m  Wt Readings from Last 3  Encounters:  06/15/19 140 lb (63.5 kg)  11/30/18 146 lb (66.2 kg)  03/16/18 159 lb (72.1 kg)    Health Maintenance Due  Topic Date Due  . COVID-19 Vaccine (1) Never done  . PNA vac Low Risk Adult (1 of 2 - PCV13) Never done     No results found for: TSH Lab Results  Component Value Date   WBC 7.1 11/30/2018   HGB 13.7 11/30/2018   HCT 40.2 11/30/2018   MCV 90 11/30/2018   PLT 309 11/30/2018   Lab Results  Component Value Date   NA 143 11/30/2018   K 4.7 11/30/2018   CO2 23 11/30/2018   GLUCOSE 77 11/30/2018   BUN 13 11/30/2018   CREATININE 1.06 11/30/2018   BILITOT 0.6 11/30/2018   ALKPHOS 92 11/30/2018   AST 22 11/30/2018   ALT 14 11/30/2018   PROT 6.1 11/30/2018   ALBUMIN 4.1 11/30/2018   CALCIUM 8.9 11/30/2018   Lab Results  Component Value Date   CHOL 131 11/30/2018   Lab Results  Component Value Date   HDL 33 (L) 11/30/2018   Lab Results  Component Value Date   LDLCALC 74 11/30/2018   Lab Results  Component Value Date   TRIG 132 11/30/2018   Lab Results  Component Value Date   CHOLHDL 4.0 11/30/2018       Assessment & Plan:   Problem List Items Addressed This Visit      Nervous and Auditory   Cerumen debris on tympanic membrane of left ear - Primary    Patient is a 64 year old male who presents with impacted left ear. Patient reports having the same issue about 9 years ago. He is not reporting any headache, dizziness or ringing in the ear. Ear canal assessed and flushed. Left EAC is patent without cerumen. Provided education to patient to proactively use Debrox to disimpact earwax at home. Printed handout provided to patient.             Ivy Lynn, NP

## 2019-12-30 ENCOUNTER — Other Ambulatory Visit: Payer: Self-pay | Admitting: Nurse Practitioner

## 2019-12-30 DIAGNOSIS — N529 Male erectile dysfunction, unspecified: Secondary | ICD-10-CM

## 2019-12-31 ENCOUNTER — Telehealth: Payer: Self-pay

## 2019-12-31 DIAGNOSIS — N529 Male erectile dysfunction, unspecified: Secondary | ICD-10-CM

## 2019-12-31 MED ORDER — TADALAFIL 20 MG PO TABS: 20.0000 mg | ORAL_TABLET | ORAL | 0 refills | Status: DC | PRN

## 2019-12-31 NOTE — Telephone Encounter (Signed)
cialis ordered

## 2020-01-29 ENCOUNTER — Encounter: Payer: Self-pay | Admitting: Nurse Practitioner

## 2020-01-29 ENCOUNTER — Ambulatory Visit (INDEPENDENT_AMBULATORY_CARE_PROVIDER_SITE_OTHER): Payer: BC Managed Care – PPO | Admitting: Nurse Practitioner

## 2020-01-29 DIAGNOSIS — N529 Male erectile dysfunction, unspecified: Secondary | ICD-10-CM

## 2020-01-29 DIAGNOSIS — K219 Gastro-esophageal reflux disease without esophagitis: Secondary | ICD-10-CM

## 2020-01-29 MED ORDER — TADALAFIL 20 MG PO TABS: 20.0000 mg | ORAL_TABLET | ORAL | 6 refills | Status: DC | PRN

## 2020-01-29 MED ORDER — RABEPRAZOLE SODIUM 20 MG PO TBEC: 20.0000 mg | DELAYED_RELEASE_TABLET | Freq: Every day | ORAL | 1 refills | Status: DC

## 2020-01-29 NOTE — Progress Notes (Signed)
Virtual Visit via telephone Note Due to COVID-19 pandemic this visit was conducted virtually. This visit type was conducted due to national recommendations for restrictions regarding the COVID-19 Pandemic (e.g. social distancing, sheltering in place) in an effort to limit this patient's exposure and mitigate transmission in our community. All issues noted in this document were discussed and addressed.  A physical exam was not performed with this format.  I connected with Evan Baker on 01/29/20 at 10:20 by telephone and verified that I am speaking with the correct person using two identifiers. Evan Baker is currently located at home and no one is currently with him during visit. The provider, Mary-Margaret Hassell Done, FNP is located in their office at time of visit.  I discussed the limitations, risks, security and privacy concerns of performing an evaluation and management service by telephone and the availability of in person appointments. I also discussed with the patient that there may be a patient responsible charge related to this service. The patient expressed understanding and agreed to proceed.   History and Present Illness:   Chief Complaint: Medical Management of Chronic Issues    HPI:  1. Gastroesophageal reflux disease, unspecified whether esophagitis present He is on achphix daily and is doing well. He has symptoms vry seldom.  2. Erectile dysfunction, unspecified erectile dysfunction type Uses cialis which works well.    Outpatient Encounter Medications as of 01/29/2020  Medication Sig  . RABEprazole (ACIPHEX) 20 MG tablet Take 1 tablet (20 mg total) by mouth daily.  . tadalafil (CIALIS) 20 MG tablet Take 1 tablet (20 mg total) by mouth as needed.   No facility-administered encounter medications on file as of 01/29/2020.    Past Surgical History:  Procedure Laterality Date  . None    . SPINE SURGERY  05/2012    Family History  Problem Relation Age of  Onset  . Coronary artery disease Father 36       Died of MI  . Coronary artery disease Paternal Uncle 45       Died of MI (?)    New complaints: none  Social history: Lives by hisself  Controlled substance contract: n/a    Review of Systems  Constitutional: Negative for diaphoresis and weight loss.  Eyes: Negative for blurred vision, double vision and pain.  Respiratory: Negative for shortness of breath.   Cardiovascular: Negative for chest pain, palpitations, orthopnea and leg swelling.  Gastrointestinal: Negative for abdominal pain.  Skin: Negative for rash.  Neurological: Negative for dizziness, sensory change, loss of consciousness, weakness and headaches.  Endo/Heme/Allergies: Negative for polydipsia. Does not bruise/bleed easily.  Psychiatric/Behavioral: Negative for memory loss. The patient does not have insomnia.   All other systems reviewed and are negative.    Observations/Objective: Alert and oriented- answers all questions appropriately No distress    Assessment and Plan: Evan Baker in today with chief complaint of Medical Management of Chronic Issues   1. Gastroesophageal reflux disease, unspecified whether esophagitis present Avoid spicy foods Do not eat 2 hours prior to bedtime - RABEprazole (ACIPHEX) 20 MG tablet; Take 1 tablet (20 mg total) by mouth daily.  Dispense: 90 tablet; Refill: 1  2. Erectile dysfunction, unspecified erectile dysfunction type - tadalafil (CIALIS) 20 MG tablet; Take 1 tablet (20 mg total) by mouth as needed.  Dispense: 18 tablet; Refill: 6     Follow Up Instructions: 3 months    I discussed the assessment and treatment plan with the patient. The patient  was provided an opportunity to ask questions and all were answered. The patient agreed with the plan and demonstrated an understanding of the instructions.   The patient was advised to call back or seek an in-person evaluation if the symptoms worsen or if the  condition fails to improve as anticipated.  The above assessment and management plan was discussed with the patient. The patient verbalized understanding of and has agreed to the management plan. Patient is aware to call the clinic if symptoms persist or worsen. Patient is aware when to return to the clinic for a follow-up visit. Patient educated on when it is appropriate to go to the emergency department.   Time call ended:  10:32  I provided 12 minutes of non-face-to-face time during this encounter.    Mary-Margaret Hassell Done, FNP

## 2020-04-29 ENCOUNTER — Other Ambulatory Visit: Payer: Self-pay

## 2020-04-29 ENCOUNTER — Ambulatory Visit: Payer: Self-pay | Admitting: Nurse Practitioner

## 2020-04-29 ENCOUNTER — Encounter: Payer: Self-pay | Admitting: Nurse Practitioner

## 2020-04-29 ENCOUNTER — Ambulatory Visit: Payer: BC Managed Care – PPO | Admitting: Nurse Practitioner

## 2020-04-29 VITALS — BP 130/78 | HR 58 | Temp 98.0°F | Resp 20 | Ht 68.0 in | Wt 159.0 lb

## 2020-04-29 DIAGNOSIS — K219 Gastro-esophageal reflux disease without esophagitis: Secondary | ICD-10-CM

## 2020-04-29 DIAGNOSIS — I493 Ventricular premature depolarization: Secondary | ICD-10-CM | POA: Diagnosis not present

## 2020-04-29 DIAGNOSIS — N529 Male erectile dysfunction, unspecified: Secondary | ICD-10-CM | POA: Diagnosis not present

## 2020-04-29 MED ORDER — RABEPRAZOLE SODIUM 20 MG PO TBEC: 20.0000 mg | DELAYED_RELEASE_TABLET | Freq: Every day | ORAL | 1 refills | Status: DC

## 2020-04-29 MED ORDER — TADALAFIL 20 MG PO TABS: 20.0000 mg | ORAL_TABLET | ORAL | 6 refills | Status: DC | PRN

## 2020-04-29 NOTE — Progress Notes (Signed)
Subjective:    Patient ID: Evan Baker, male    DOB: [birth year], 65 y.o.   MRN: [id number]   Chief Complaint: Medical Management of Chronic Issues    HPI:  1. PVC (premature ventricular contraction) Patient denies any palpitations  2. Gastroesophageal reflux disease, unspecified whether esophagitis present Is on aciphex dialy and works well to keep symptoms under control.  3. Erectile dysfunction, unspecified erectile dysfunction type Uses cialis as needed.    Outpatient Encounter Medications as of 04/29/2020  Medication Sig  . RABEprazole (ACIPHEX) 20 MG tablet Take 1 tablet (20 mg total) by mouth daily.  . tadalafil (CIALIS) 20 MG tablet Take 1 tablet (20 mg total) by mouth as needed.   No facility-administered encounter medications on file as of 04/29/2020.    Past Surgical History:  Procedure Laterality Date  . None    . SPINE SURGERY  05/2012    Family History  Problem Relation Age of Onset  . Coronary artery disease Father 37       Died of MI  . Coronary artery disease Paternal Uncle 42       Died of MI (?)    New complaints: None today  Social history: Lives by hisself. Works at [employer]  Controlled substance contract: n/a    Review of Systems  Constitutional: Negative for diaphoresis.  Eyes: Negative for pain.  Respiratory: Negative for shortness of breath.   Cardiovascular: Negative for chest pain, palpitations and leg swelling.  Gastrointestinal: Negative for abdominal pain.  Endocrine: Negative for polydipsia.  Skin: Negative for rash.  Neurological: Negative for dizziness, weakness and headaches.  Hematological: Does not bruise/bleed easily.  All other systems reviewed and are negative.      Objective:   Physical Exam Vitals and nursing note reviewed.  Constitutional:      Appearance: Normal appearance. He is well-developed.  HENT:     Head: Normocephalic.     Nose: Nose normal.  Eyes:     Pupils: Pupils are equal, round, and  reactive to light.  Neck:     Thyroid: No thyroid mass or thyromegaly.     Vascular: No carotid bruit or JVD.     Trachea: Phonation normal.  Cardiovascular:     Rate and Rhythm: Normal rate and regular rhythm.  Pulmonary:     Effort: Pulmonary effort is normal. No respiratory distress.     Breath sounds: Normal breath sounds.  Abdominal:     General: Bowel sounds are normal.     Palpations: Abdomen is soft.     Tenderness: There is no abdominal tenderness.  Musculoskeletal:        General: Normal range of motion.     Cervical back: Normal range of motion and neck supple.  Lymphadenopathy:     Cervical: No cervical adenopathy.  Skin:    General: Skin is warm and dry.  Neurological:     Mental Status: He is alert and oriented to person, place, and time.  Psychiatric:        Behavior: Behavior normal.        Thought Content: Thought content normal.        Judgment: Judgment normal.     BP 130/78   Pulse (!) 58   Temp 98 F (36.7 C) (Temporal)   Resp 20   Ht _0  (1.727 m)   Wt 159 lb (72.1 kg)   SpO2 99%   BMI 24.18 kg/m  Assessment & Plan:  Evan Baker comes in today with chief complaint of Medical Management of Chronic Issues   Diagnosis and orders addressed:  1. PVC (premature ventricular contraction) Avoid caffeine Report any palpitations  2. Gastroesophageal reflux disease, unspecified whether esophagitis present Avoid spicy foods Do not eat 2 hours prior to bedtime - RABEprazole (ACIPHEX) 20 MG tablet; Take 1 tablet (20 mg total) by mouth daily.  Dispense: 90 tablet; Refill: 1 - CBC with Differential/Platelet - CMP14+EGFR - Lipid panel  3. Erectile dysfunction, unspecified erectile dysfunction type - tadalafil (CIALIS) 20 MG tablet; Take 1 tablet (20 mg total) by mouth as needed.  Dispense: 18 tablet; Refill: 6 - PSA, total and free   Labs pending Health Maintenance reviewed Diet and exercise encouraged  Follow up plan: 1  year    Mary-Margaret Hassell Done, FNP

## 2020-04-30 LAB — CMP14+EGFR
ALT: 11 IU/L (ref 0–44)
AST: 20 IU/L (ref 0–40)
Albumin/Globulin Ratio: 1.8 (ref 1.2–2.2)
Albumin: 4.4 g/dL (ref 3.8–4.8)
Alkaline Phosphatase: 110 IU/L (ref 44–121)
BUN/Creatinine Ratio: 9 — ABNORMAL LOW (ref 10–24)
BUN: 11 mg/dL (ref 8–27)
Bilirubin Total: 1 mg/dL (ref 0.0–1.2)
CO2: 22 mmol/L (ref 20–29)
Calcium: 9.4 mg/dL (ref 8.6–10.2)
Chloride: 103 mmol/L (ref 96–106)
Creatinine, Ser: 1.19 mg/dL (ref 0.76–1.27)
Globulin, Total: 2.4 g/dL (ref 1.5–4.5)
Glucose: 86 mg/dL (ref 65–99)
Potassium: 4.7 mmol/L (ref 3.5–5.2)
Sodium: 141 mmol/L (ref 134–144)
Total Protein: 6.8 g/dL (ref 6.0–8.5)
eGFR: 67 mL/min/{1.73_m2} (ref >59)

## 2020-04-30 LAB — CBC WITH DIFFERENTIAL/PLATELET
Basophils Absolute: 0.1 10*3/uL (ref 0.0–0.2)
Basos: 1 %
EOS (ABSOLUTE): 0 10*3/uL (ref 0.0–0.4)
Eos: 1 %
Hematocrit: 44.9 % (ref 37.5–51.0)
Hemoglobin: 15.2 g/dL (ref 13.0–17.7)
Immature Grans (Abs): 0.1 10*3/uL (ref 0.0–0.1)
Immature Granulocytes: 1 %
Lymphocytes Absolute: 1.7 10*3/uL (ref 0.7–3.1)
Lymphs: 25 %
MCH: 29.5 pg (ref 26.6–33.0)
MCHC: 33.9 g/dL (ref 31.5–35.7)
MCV: 87 fL (ref 79–97)
Monocytes Absolute: 0.7 10*3/uL (ref 0.1–0.9)
Monocytes: 10 %
Neutrophils Absolute: 4.5 10*3/uL (ref 1.4–7.0)
Neutrophils: 62 %
Platelets: 296 10*3/uL (ref 150–450)
RBC: 5.15 x10E6/uL (ref 4.14–5.80)
RDW: 14.6 % (ref 11.6–15.4)
WBC: 7.1 10*3/uL (ref 3.4–10.8)

## 2020-04-30 LAB — LIPID PANEL
Chol/HDL Ratio: 4.2 ratio (ref 0.0–5.0)
Cholesterol, Total: 166 mg/dL (ref 100–199)
HDL: 40 mg/dL (ref >39)
LDL Chol Calc (NIH): 113 mg/dL — ABNORMAL HIGH (ref 0–99)
Triglycerides: 69 mg/dL (ref 0–149)
VLDL Cholesterol Cal: 13 mg/dL (ref 5–40)

## 2020-04-30 LAB — PSA, TOTAL AND FREE
PSA, Free Pct: 28.8 %
PSA, Free: 0.72 ng/mL
Prostate Specific Ag, Serum: 2.5 ng/mL (ref 0.0–4.0)

## 2020-06-19 DIAGNOSIS — M7732 Calcaneal spur, left foot: Secondary | ICD-10-CM | POA: Diagnosis not present

## 2020-06-19 DIAGNOSIS — M722 Plantar fascial fibromatosis: Secondary | ICD-10-CM | POA: Diagnosis not present

## 2020-06-27 DIAGNOSIS — M722 Plantar fascial fibromatosis: Secondary | ICD-10-CM | POA: Diagnosis not present

## 2020-07-04 DIAGNOSIS — M71572 Other bursitis, not elsewhere classified, left ankle and foot: Secondary | ICD-10-CM | POA: Diagnosis not present

## 2020-07-04 DIAGNOSIS — M722 Plantar fascial fibromatosis: Secondary | ICD-10-CM | POA: Diagnosis not present

## 2020-07-15 DIAGNOSIS — M71571 Other bursitis, not elsewhere classified, right ankle and foot: Secondary | ICD-10-CM | POA: Diagnosis not present

## 2020-07-15 DIAGNOSIS — M722 Plantar fascial fibromatosis: Secondary | ICD-10-CM | POA: Diagnosis not present

## 2020-07-28 ENCOUNTER — Ambulatory Visit: Payer: Self-pay | Admitting: Nurse Practitioner

## 2020-09-08 IMAGING — DX DG CHEST 2V
2 series · 2 of 2 positions shown · non-contrast
Comparison: 03/24/2016

CLINICAL DATA: Cough for 6 weeks. Diagnosed with bronchitis 3 weeks
ago with antibiotic treatment. Cough persists.

EXAM:
CHEST - 2 VIEW

[chest pa]
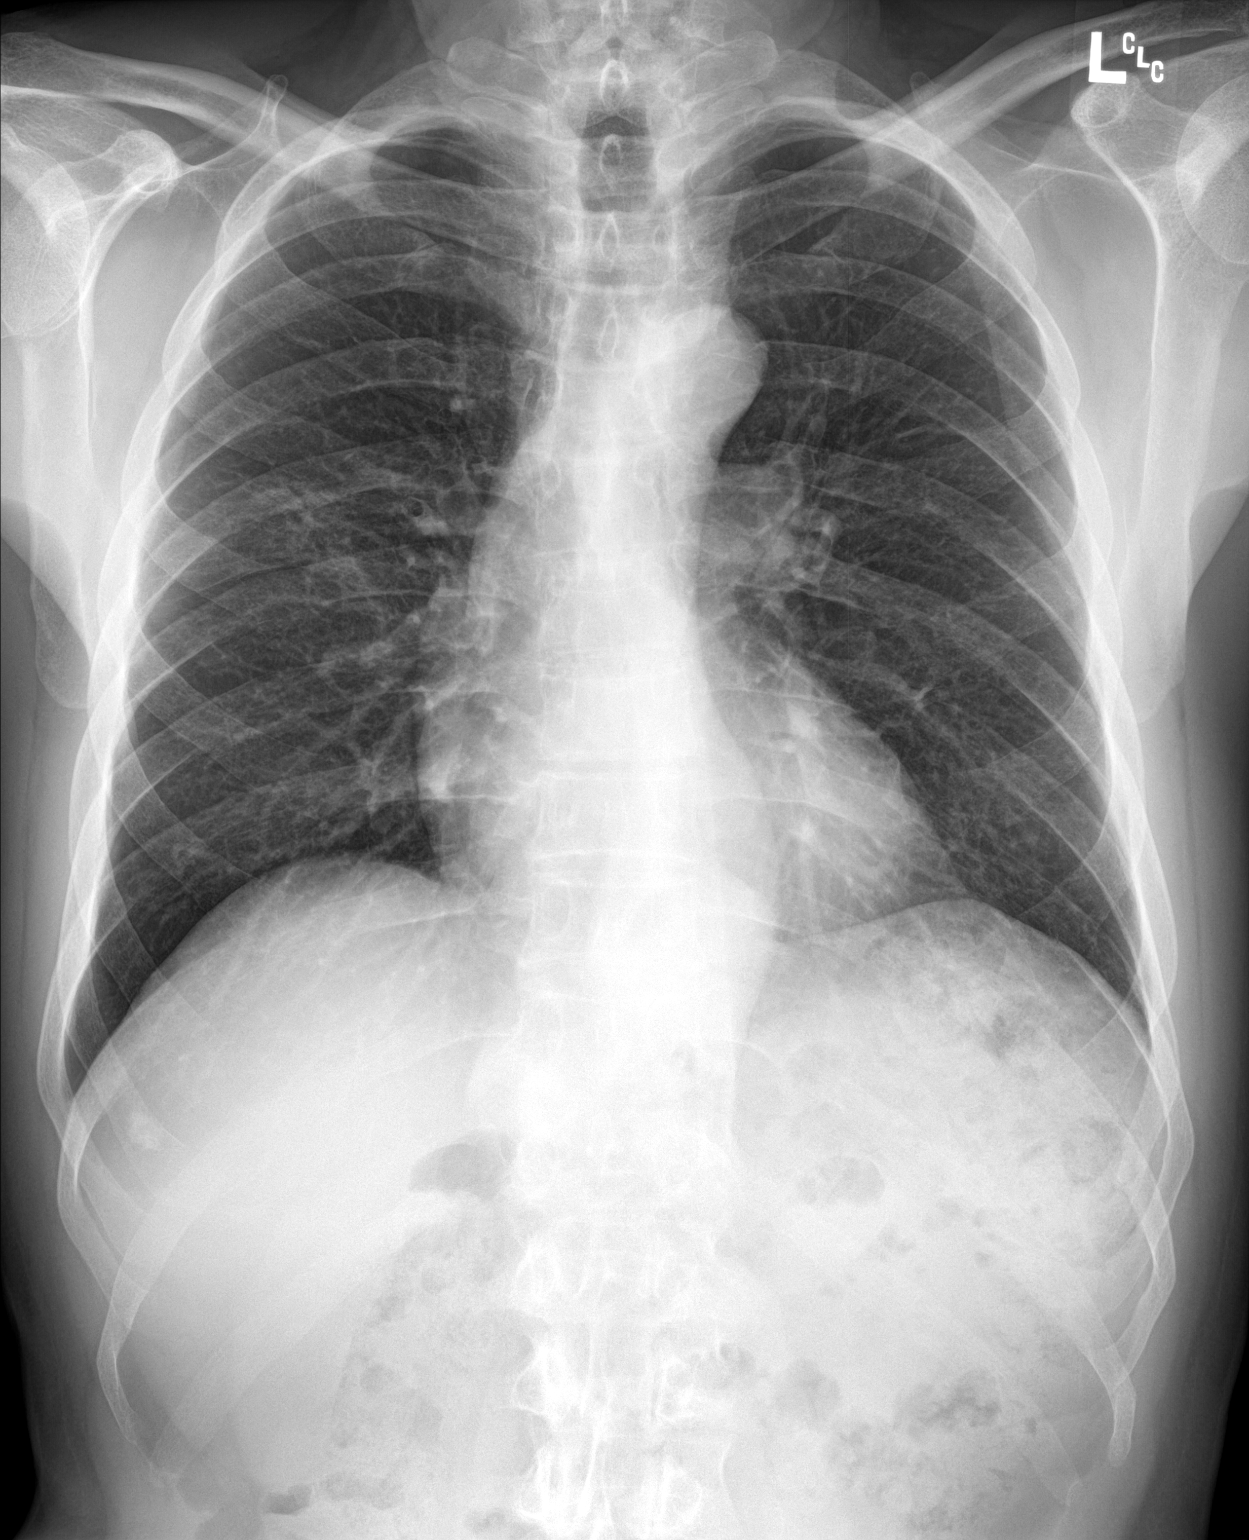

[chest lat]
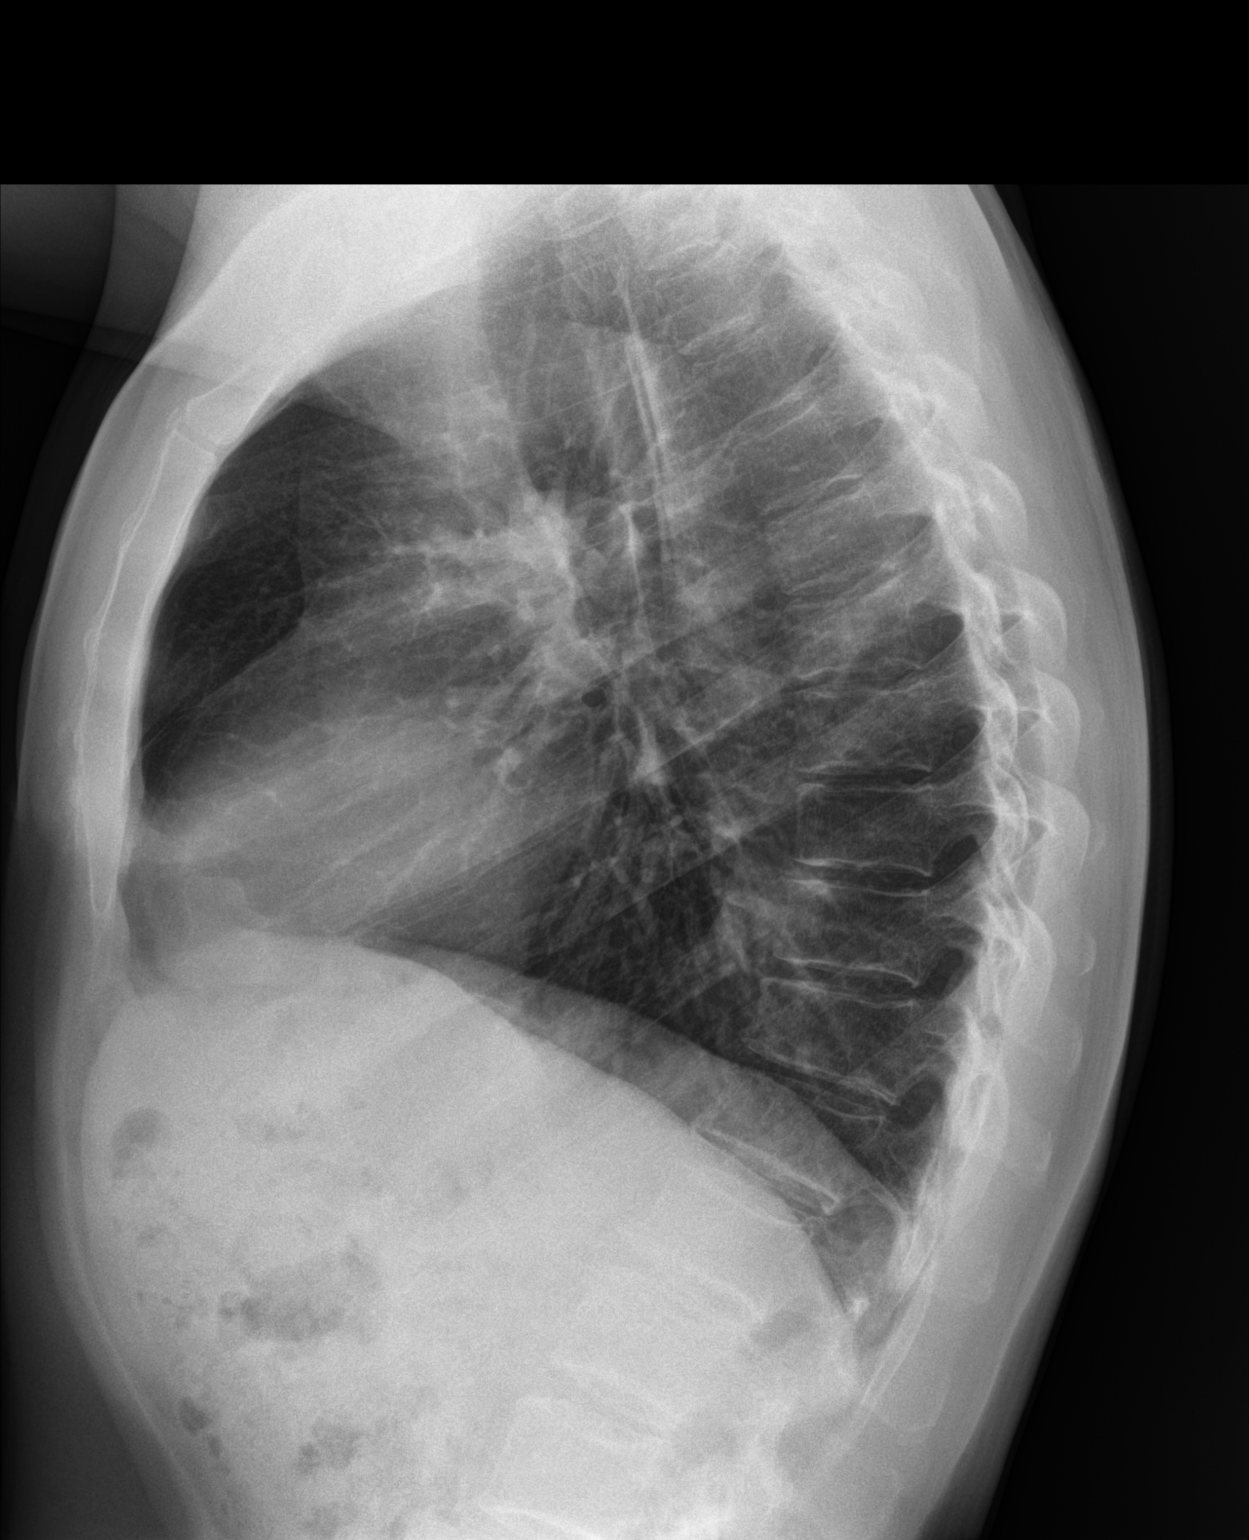

[2 of 2 positions shown; findings below may reference images not displayed]

FINDINGS: Cardiac silhouette is normal in size. No mediastinal or hilar
masses. No evidence of adenopathy.

Lungs are mildly hyperexpanded. There is a small focus of opacity in
the inferior right upper lobe near the minor fissure, new since the
prior exam. Lungs are otherwise clear.

No pleural effusion or pneumothorax.

Skeletal structures are intact.
IMPRESSION: 1. Small focus of opacity in the inferior right upper lobe. Small
area of pneumonia is suspected.
2. No other evidence of acute cardiopulmonary disease.

## 2021-05-22 DIAGNOSIS — L03116 Cellulitis of left lower limb: Secondary | ICD-10-CM | POA: Diagnosis not present

## 2021-05-22 DIAGNOSIS — Z6823 Body mass index (BMI) 23.0-23.9, adult: Secondary | ICD-10-CM | POA: Diagnosis not present

## 2021-05-27 DIAGNOSIS — M7042 Prepatellar bursitis, left knee: Secondary | ICD-10-CM | POA: Diagnosis not present

## 2021-12-23 DIAGNOSIS — X32XXXA Exposure to sunlight, initial encounter: Secondary | ICD-10-CM | POA: Diagnosis not present

## 2021-12-23 DIAGNOSIS — D225 Melanocytic nevi of trunk: Secondary | ICD-10-CM | POA: Diagnosis not present

## 2021-12-23 DIAGNOSIS — L570 Actinic keratosis: Secondary | ICD-10-CM | POA: Diagnosis not present

## 2022-03-31 DIAGNOSIS — R208 Other disturbances of skin sensation: Secondary | ICD-10-CM | POA: Diagnosis not present

## 2022-03-31 DIAGNOSIS — X32XXXD Exposure to sunlight, subsequent encounter: Secondary | ICD-10-CM | POA: Diagnosis not present

## 2022-03-31 DIAGNOSIS — L570 Actinic keratosis: Secondary | ICD-10-CM | POA: Diagnosis not present

## 2022-08-05 ENCOUNTER — Ambulatory Visit (INDEPENDENT_AMBULATORY_CARE_PROVIDER_SITE_OTHER): Payer: BC Managed Care – PPO

## 2022-08-05 ENCOUNTER — Ambulatory Visit: Payer: BC Managed Care – PPO | Admitting: Nurse Practitioner

## 2022-08-05 ENCOUNTER — Encounter: Payer: Self-pay | Admitting: Nurse Practitioner

## 2022-08-05 VITALS — BP 105/60 | HR 70 | Temp 97.6°F | Ht 68.0 in | Wt 158.8 lb

## 2022-08-05 DIAGNOSIS — R058 Other specified cough: Secondary | ICD-10-CM | POA: Diagnosis not present

## 2022-08-05 DIAGNOSIS — J439 Emphysema, unspecified: Secondary | ICD-10-CM | POA: Diagnosis not present

## 2022-08-05 DIAGNOSIS — J40 Bronchitis, not specified as acute or chronic: Secondary | ICD-10-CM

## 2022-08-05 DIAGNOSIS — R059 Cough, unspecified: Secondary | ICD-10-CM | POA: Diagnosis not present

## 2022-08-05 DIAGNOSIS — K449 Diaphragmatic hernia without obstruction or gangrene: Secondary | ICD-10-CM | POA: Diagnosis not present

## 2022-08-05 MED ORDER — AMOXICILLIN 875 MG PO TABS: 875.0000 mg | ORAL_TABLET | Freq: Two times a day (BID) | ORAL | 0 refills | Status: DC

## 2022-08-05 NOTE — Progress Notes (Signed)
Acute Office Visit  Subjective:     Patient ID: Evan Baker, male    DOB: [birth year], 67 y.o.   MRN: [id number]  Chief Complaint  Patient presents with   Cough    Has had a cough for about 3 weeks. Coughing up mucus.     HPI Evan Baker is a 67 yrs old male seen today for an acute visit for cough likely was result. Cough: Patient complains of productive cough with sputum described as green.  Symptoms began 3 week ago.  The cough is without wheezing, dyspnea or hemoptysis, productive of green/yellow sputum and is aggravated by nothing Associated symptoms include:change in voice and sputum production. Patient does not have new pets. Patient does not have a history of asthma. Patient does not have a history of environmental allergens. Patient does not have recent travel. Patient does not have a history of smoking. Patient  has previous Chest X-ray. Patient has not had a PPD done.   Review of Systems  Constitutional:  Negative for chills, fever and malaise/fatigue.  HENT:  Negative for congestion, ear pain and sore throat.   Respiratory:  Positive for cough and wheezing.   Cardiovascular:  Negative for chest pain and leg swelling.  Gastrointestinal:  Negative for melena, nausea and vomiting.  Musculoskeletal:  Negative for myalgias.  Skin:  Negative for itching and rash.  Neurological:  Negative for dizziness and weakness.  Endo/Heme/Allergies:  Negative for polydipsia. Does not bruise/bleed easily.  Psychiatric/Behavioral:  Negative for depression and suicidal ideas. The patient does not have insomnia.    Negative unless indicated in HPI    Objective:    BP 105/60   Pulse 70   Temp 97.6 F (36.4 C) (Temporal)   Ht 5\' 8"  (1.727 m)   Wt 158 lb 12.8 oz (72 kg)   SpO2 97%   BMI 24.15 kg/m  BP Readings from Last 3 Encounters:  08/05/22 105/60  04/29/20 130/78  06/15/19 103/63   Wt Readings from Last 3 Encounters:  08/05/22 158 lb 12.8 oz (72 kg)  04/29/20 159 lb  (72.1 kg)  06/15/19 140 lb (63.5 kg)      Physical Exam Constitutional:      General: He is not in acute distress.    Appearance: Normal appearance.  HENT:     Head: Normocephalic and atraumatic.     Right Ear: Hearing, tympanic membrane, ear canal and external ear normal.     Left Ear: Hearing, tympanic membrane, ear canal and external ear normal.     Nose: No congestion or rhinorrhea.     Right Sinus: No maxillary sinus tenderness or frontal sinus tenderness.     Left Sinus: No maxillary sinus tenderness or frontal sinus tenderness.  Eyes:     General: No scleral icterus.    Extraocular Movements: Extraocular movements intact.     Conjunctiva/sclera: Conjunctivae normal.     Pupils: Pupils are equal, round, and reactive to light.  Cardiovascular:     Rate and Rhythm: Normal rate and regular rhythm.     Pulses: Normal pulses.     Heart sounds: Normal heart sounds.  Pulmonary:     Breath sounds: Examination of the right-lower field reveals wheezing. Examination of the left-lower field reveals wheezing. Wheezing present.  Musculoskeletal:        General: Normal range of motion.     Right lower leg: No edema.     Left lower leg: No edema.  Skin:  General: Skin is warm and dry.     Coloration: Skin is not jaundiced.     Findings: No rash.  Neurological:     General: No focal deficit present.     Mental Status: He is alert and oriented to person, place, and time. Mental status is at baseline.  Psychiatric:        Mood and Affect: Mood normal.        Behavior: Behavior normal.        Thought Content: Thought content normal.        Judgment: Judgment normal.     No results found for any visits on 08/05/22.      Assessment & Plan:  Cough productive of purulent sputum -     DG Chest 2 View -     Amoxicillin; Take 1 tablet (875 mg total) by mouth 2 (two) times daily.  Dispense: 14 tablet; Refill: 0  Bronchitis   ASSESSMENT:  Jai was seen today for productive  cough with purulent drainage   bronchitis  PLAN: Symptomatic therapy suggested:  Amoxicillin 875 mg BID for 7 days # 14 dispensed X-ray: preliminary reports possible bronchitis, awaiting for final report from radiologist Increae hydration, rest, return office visit prn if symptoms persist or worsen.  Lack of antibiotic effectiveness discussed with him.  Call or return to clinic prn if these symptoms worsen or fail to improve as anticipated.     Continue healthy lifestyle choices, including diet (rich in fruits, vegetables, and lean proteins, and low in salt and simple carbohydrates) and exercise (at least 30 minutes of moderate physical activity daily).     The above assessment and management plan was discussed with the patient. The patient verbalized understanding of and has agreed to the management plan. Patient is aware to call the clinic if they develop any new symptoms or if symptoms persist or worsen. Patient is aware when to return to the clinic for a follow-up visit. Patient educated on when it is appropriate to go to the emergency department.  Return if symptoms worsen or fail to improve.  Arrie Aran Santa Lighter, DNP [facility] [address] [place], [place] [address] [phone]

## 2022-12-06 DIAGNOSIS — M25531 Pain in right wrist: Secondary | ICD-10-CM | POA: Diagnosis not present

## 2022-12-06 DIAGNOSIS — Z043 Encounter for examination and observation following other accident: Secondary | ICD-10-CM | POA: Diagnosis not present

## 2023-02-07 ENCOUNTER — Ambulatory Visit: Payer: Self-pay | Admitting: Nurse Practitioner

## 2023-02-07 NOTE — Telephone Encounter (Signed)
  Chief Complaint: Genital Rash-Injury Symptoms:  Two Cuts on Penile Shaft Frequency: 3 Weeks Pertinent Negatives: Patient denies fever or systemic symptoms Disposition: [] ED /[x] Urgent Care (no appt availability in office) / [] Appointment(In office/virtual)/ []  [place] [facility]/ [] Home Care/ [] Refused Recommended Disposition /[]  Mobile Bus/ []  Follow-up with PCP Additional Notes: GR is being triaged regarding dropping a razor and cutting himself twice on his genital area. He reports the cuts as an inch wide and an inch long. No in office availability, opted to go to an urgent care due to him going back to work tomorrow.   Reason for Disposition  [1] No prior tetanus shots (or is not fully vaccinated) AND [2] any wound (e.g., cut, scrape)  Answer Assessment - Initial Assessment Questions 1. MECHANISM: "How did the injury happen?" (Injuries to the penis can occur during sexual intercourse or masturbation; the caller may not be willing to mention this)      Razor Cut  2. ONSET: "When did the injury happen?" (Minutes or hours ago)      3 Weeks  3. LOCATION: "What part of the genitals are injured?"      Penis, Shaft  4. APPEARANCE of INJURY: "What does the injury look like?"      Red Streaks   5. BLEEDING: "Is the injury still bleeding?" If Yes, ask: "Is it difficult to stop?"      No  6. SIZE: For cuts, bruises, or swelling, ask: "How large is it?" (e.g., inches or centimeters)      1 inch wide and long  7. PAIN: "Is it painful?" If Yes, ask: "How bad is the pain?"    (e.g., Scale 1-10; or mild, moderate, severe)     Tender, No constant pain  8. TETANUS: For any breaks in the skin, ask: "When was the last tetanus booster?"     5-6 years ago  9. OTHER SYMPTOMS: "Do you have any other symptoms?" (e.g., blood from tip of penis, bloody urine)     Lower back pain  Protocols used: Genital Injury - Male-A-AH

## 2023-05-26 ENCOUNTER — Other Ambulatory Visit: Payer: Self-pay | Admitting: Nurse Practitioner

## 2023-05-26 ENCOUNTER — Ambulatory Visit
Admission: RE | Admit: 2023-05-26 | Discharge: 2023-05-26 | Disposition: A | Discharge status: Home / Self Care | Source: Ambulatory Visit | Attending: Nurse Practitioner | Admitting: Nurse Practitioner

## 2023-05-26 DIAGNOSIS — K5900 Constipation, unspecified: Secondary | ICD-10-CM

## 2023-05-31 ENCOUNTER — Ambulatory Visit (INDEPENDENT_AMBULATORY_CARE_PROVIDER_SITE_OTHER): Payer: Self-pay | Admitting: Nurse Practitioner

## 2023-05-31 ENCOUNTER — Encounter: Payer: Self-pay | Admitting: Nurse Practitioner

## 2023-05-31 ENCOUNTER — Ambulatory Visit: Payer: Self-pay | Admitting: Nurse Practitioner

## 2023-05-31 VITALS — BP 135/80 | HR 67 | Temp 97.7°F | Ht 68.0 in | Wt 159.0 lb

## 2023-05-31 DIAGNOSIS — M5441 Lumbago with sciatica, right side: Secondary | ICD-10-CM | POA: Diagnosis not present

## 2023-05-31 DIAGNOSIS — R350 Frequency of micturition: Secondary | ICD-10-CM

## 2023-05-31 LAB — MICROSCOPIC EXAMINATION
RBC, Urine: NONE SEEN /HPF (ref 0–2)
Renal Epithel, UA: NONE SEEN /HPF
Yeast, UA: NONE SEEN

## 2023-05-31 LAB — URINALYSIS, ROUTINE W REFLEX MICROSCOPIC
Bilirubin, UA: NEGATIVE
Glucose, UA: NEGATIVE
Nitrite, UA: NEGATIVE
Specific Gravity, UA: 1.025 (ref 1.005–1.030)
Urobilinogen, Ur: 1 mg/dL (ref 0.2–1.0)
pH, UA: 6 (ref 5.0–7.5)

## 2023-05-31 MED ORDER — PREDNISONE 10 MG (21) PO TBPK
ORAL_TABLET | ORAL | 0 refills | Status: AC
Start: 2023-05-31 — End: ?

## 2023-05-31 NOTE — Progress Notes (Signed)
   Subjective:    Patient ID: Evan Baker, male    DOB: July 05, 1955, 68 y.o.   MRN: [id number]   Chief Complaint: Urinary Frequency (Back pain/) and Buldge below waist   Urinary Frequency  Associated symptoms include frequency.    Patient went to GI doctor last week for abdominal pain and back pan. Had KUB which showed constipation. He is not sure if he has a UTI or not. He says he has had back pain for several weeks, not sure of cause. He denies any injury. Right lower back is sensative to the touch and hurts when he is active. Pain will sometimes go down to his leg. Rates back pain 4-5/10. He doe snot take anything for his pain. Patient Active Problem List   Diagnosis Date Noted   Cough productive of purulent sputum 08/05/2022   Bronchitis 08/05/2022   Chronic back pain 06/05/2014   Erectile dysfunction 06/05/2014   GERD (gastroesophageal reflux disease) 04/07/2012   PVC (premature ventricular contraction) 05/05/2011   Family history of acute myocardial infarction 05/05/2011        Review of Systems  Genitourinary:  Positive for frequency.  Musculoskeletal:  Positive for back pain.       Objective:   Physical Exam Constitutional:      Appearance: Normal appearance.  Cardiovascular:     Rate and Rhythm: Normal rate and regular rhythm.     Heart sounds: Normal heart sounds.  Pulmonary:     Effort: Pulmonary effort is normal.     Breath sounds: Normal breath sounds.  Abdominal:     General: Abdomen is flat. Bowel sounds are normal.     Palpations: Abdomen is soft.     Hernia: A hernia (left inguinal area- nontender) is present.  Musculoskeletal:     Comments: FROM of lumbar spine with pain on flexion and extension (-) SLR bil Motor strength and sensation distally intact.  Neurological:     Mental Status: He is alert.     BP 135/80   Pulse 67   Temp 97.7 F (36.5 C) (Temporal)   Ht 5\' 8"  (1.727 m)   Wt 159 lb (72.1 kg)   SpO2 100%   BMI 24.18 kg/m    Urine clear      Assessment & Plan:   Evan Baker in today with chief complaint of Urinary Frequency (Back pain/) and Buldge below waist   1. Frequent urination (Primary) urine clear - Urinalysis, Routine w reflex microscopic - Urine Culture  2. Acute right-sided low back pain with right-sided sciatica Moist heat Rest RTOprn - predniSONE  (STERAPRED UNI-PAK 21 TAB) 10 MG (21) TBPK tablet; As directed x 6 days  Dispense: 21 tablet; Refill: 0    The above assessment and management plan was discussed with the patient. The patient verbalized understanding of and has agreed to the management plan. Patient is aware to call the clinic if symptoms persist or worsen. Patient is aware when to return to the clinic for a follow-up visit. Patient educated on when it is appropriate to go to the emergency department.   Mary-Margaret Gaylyn Keas, FNP

## 2023-05-31 NOTE — Patient Instructions (Signed)
 Acute Back Pain, Adult Acute back pain is sudden and usually short-lived. It is often caused by an injury to the muscles and tissues in the back. The injury may result from: A muscle, tendon, or ligament getting overstretched or torn. Ligaments are tissues that connect bones to each other. Lifting something improperly can cause a back strain. Wear and tear (degeneration) of the spinal disks. Spinal disks are circular tissue that provide cushioning between the bones of the spine (vertebrae). Twisting motions, such as while playing sports or doing yard work. A hit to the back. Arthritis. You may have a physical exam, lab tests, and imaging tests to find the cause of your pain. Acute back pain usually goes away with rest and home care. Follow these instructions at home: Managing pain, stiffness, and swelling Take over-the-counter and prescription medicines only as told by your health care provider. Treatment may include medicines for pain and inflammation that are taken by mouth or applied to the skin, or muscle relaxants. Your health care provider may recommend applying ice during the first 24-48 hours after your pain starts. To do this: Put ice in a plastic bag. Place a towel between your skin and the bag. Leave the ice on for 20 minutes, 2-3 times a day. Remove the ice if your skin turns bright red. This is very important. If you cannot feel pain, heat, or cold, you have a greater risk of damage to the area. If directed, apply heat to the affected area as often as told by your health care provider. Use the heat source that your health care provider recommends, such as a moist heat pack or a heating pad. Place a towel between your skin and the heat source. Leave the heat on for 20-30 minutes. Remove the heat if your skin turns bright red. This is especially important if you are unable to feel pain, heat, or cold. You have a greater risk of getting burned. Activity  Do not stay in bed. Staying in  bed for more than 1-2 days can delay your recovery. Sit up and stand up straight. Avoid leaning forward when you sit or hunching over when you stand. If you work at a desk, sit close to it so you do not need to lean over. Keep your chin tucked in. Keep your neck drawn back, and keep your elbows bent at a 90-degree angle (right angle). Sit high and close to the steering wheel when you drive. Add lower back (lumbar) support to your car seat, if needed. Take short walks on even surfaces as soon as you are able. Try to increase the length of time you walk each day. Do not sit, drive, or stand in one place for more than 30 minutes at a time. Sitting or standing for long periods of time can put stress on your back. Do not drive or use heavy machinery while taking prescription pain medicine. Use proper lifting techniques. When you bend and lift, use positions that put less stress on your back: [place] your knees. Keep the load close to your body. Avoid twisting. Exercise regularly as told by your health care provider. Exercising helps your back heal faster and helps prevent back injuries by keeping muscles strong and flexible. Work with a physical therapist to make a safe exercise program, as recommended by your health care provider. Do any exercises as told by your physical therapist. Lifestyle Maintain a healthy weight. Extra weight puts stress on your back and makes it difficult to have good  posture. Avoid activities or situations that make you feel anxious or stressed. Stress and anxiety increase muscle tension and can make back pain worse. Learn ways to manage anxiety and stress, such as through exercise. General instructions Sleep on a firm mattress in a comfortable position. Try lying on your side with your knees slightly bent. If you lie on your back, put a pillow under your knees. Keep your head and neck in a straight line with your spine (neutral position) when using electronic equipment like  smartphones or pads. To do this: Raise your smartphone or pad to look at it instead of bending your head or neck to look down. Put the smartphone or pad at the level of your face while looking at the screen. Follow your treatment plan as told by your health care provider. This may include: Cognitive or behavioral therapy. Acupuncture or massage therapy. Meditation or yoga. Contact a health care provider if: You have pain that is not relieved with rest or medicine. You have increasing pain going down into your legs or buttocks. Your pain does not improve after 2 weeks. You have pain at night. You lose weight without trying. You have a fever or chills. You develop nausea or vomiting. You develop abdominal pain. Get help right away if: You develop new bowel or bladder control problems. You have unusual weakness or numbness in your arms or legs. You feel faint. These symptoms may represent a serious problem that is an emergency. Do not wait to see if the symptoms will go away. Get medical help right away. Call your local emergency services (911 in the [place] Do not drive yourself to the hospital. Summary Acute back pain is sudden and usually short-lived. Use proper lifting techniques. When you bend and lift, use positions that put less stress on your back. Take over-the-counter and prescription medicines only as told by your health care provider, and apply heat or ice as told. This information is not intended to replace advice given to you by your health care provider. Make sure you discuss any questions you have with your health care provider. Document Revised: 03/21/2020 Document Reviewed: 03/21/2020 Elsevier Patient Education  2024 [employer].

## 2023-06-01 LAB — URINE CULTURE: Organism ID, Bacteria: NO GROWTH

## 2023-07-07 ENCOUNTER — Ambulatory Visit: Admitting: Nurse Practitioner

## 2023-07-07 ENCOUNTER — Encounter: Payer: Self-pay | Admitting: Nurse Practitioner

## 2023-07-07 VITALS — BP 130/78 | HR 73 | Temp 98.1°F | Ht 68.0 in | Wt 162.0 lb

## 2023-07-07 DIAGNOSIS — Z0000 Encounter for general adult medical examination without abnormal findings: Secondary | ICD-10-CM | POA: Diagnosis not present

## 2023-07-07 NOTE — Progress Notes (Signed)
 Subjective:    Evan Baker is a 68 y.o. male who presents for a Welcome to Medicare exam.   Cardiac Risk Factors include: advanced age (>85men, >7 women);male gender     Objective:    Today's Vitals   07/07/23 0922  BP: 130/78  Pulse: 73  Temp: 98.1 F (36.7 C)  TempSrc: Temporal  SpO2: 100%  Weight: 162 lb (73.5 kg)  Height: 5' 8 (1.727 m)   Body mass index is 24.63 kg/m.  Medications Outpatient Encounter Medications as of 07/07/2023  Medication Sig   predniSONE  (STERAPRED UNI-PAK 21 TAB) 10 MG (21) TBPK tablet As directed x 6 days   tadalafil  (CIALIS ) 20 MG tablet Take 1 tablet (20 mg total) by mouth as needed. (Patient not taking: Reported on 07/07/2023)   No facility-administered encounter medications on file as of 07/07/2023.     History: Past Medical History:  Diagnosis Date   GERD (gastroesophageal reflux disease)    Past Surgical History:  Procedure Laterality Date   None     SPINE SURGERY  05/2012    Family History  Problem Relation Age of Onset   Coronary artery disease Father 65       Died of MI   Coronary artery disease Paternal Uncle 36       Died of MI (?)   Social History   Occupational History   Occupation: [employer] doctor  Tobacco Use   Smoking status: Never   Smokeless tobacco: Never  Vaping Use   Vaping status: Never Used  Substance and Sexual Activity   Alcohol use: No   Drug use: No   Sexual activity: Not on file    Tobacco Counseling Counseling given: Not Answered   Immunizations and Health Maintenance Immunization History  Administered Date(s) Administered   Influenza Inj Mdck Quad Pf 10/12/2016   Influenza,inj,Quad PF,6+ Mos 12/23/2015   Influenza-Unspecified 10/24/2014, 10/12/2016, 10/23/2018   PFIZER(Purple Top)SARS-COV-2 Vaccination 03/29/2019, 04/13/2019, 12/13/2019   Health Maintenance Due  Topic Date Due   DTaP/Tdap/Td (1 - Tdap) Never done   Pneumococcal Vaccine: 50+ Years (1 of 1 - PCV) Never done    Zoster Vaccines- Shingrix (1 of 2) Never done   COVID-19 Vaccine (4 - 2024-25 season) 09/12/2022    Activities of Daily Living    07/07/2023    9:27 AM  In your present state of health, do you have any difficulty performing the following activities:  Hearing? 0  Vision? 0  Difficulty concentrating or making decisions? 0  Walking or climbing stairs? 0  Dressing or bathing? 0  Doing errands, shopping? 0  Preparing Food and eating ? N  Using the Toilet? N  In the past six months, have you accidently leaked urine? N  Do you have problems with loss of bowel control? N  Managing your Medications? N  Managing your Finances? N  Housekeeping or managing your Housekeeping? N    Physical Exam   Physical Exam (optional), or other factors deemed appropriate based on the beneficiary's medical and social history and current clinical standards.   Advanced Directives: Does Patient Have a Medical Advance Directive?: Yes Type of Advance Directive: Living will Does patient want to make changes to medical advance directive?: No - Patient declined   EKG:  normal EKG, normal sinus rhythm     Assessment:    This is a routine wellness  examination for this patient . Welcome  to medicare  Vision/Hearing screen No results found.   Goals  DIET - EAT MORE FRUITS AND VEGETABLES     Exercise 150 min/wk Moderate Activity         Depression Screen    07/07/2023    9:35 AM 05/31/2023   11:03 AM 08/05/2022    9:47 AM 04/29/2020    9:32 AM  PHQ 2/9 Scores  PHQ - 2 Score 0 0 0 0  PHQ- 9 Score   0      Fall Risk    07/07/2023    9:34 AM  Fall Risk   Falls in the past year? 0    Cognitive Function        07/07/2023    9:35 AM  6CIT Screen  What Year? 0 points  What month? 0 points  What time? 0 points  Count back from 20 0 points  Months in reverse 0 points  Repeat phrase 0 points  Total Score 0 points    Patient Care Team: Gladis Mustard, FNP as PCP - General  (Nurse Practitioner)     Plan:   Welcome to medicare  I have personally reviewed and noted the following in the patient's chart:   Medical and social history Use of alcohol, tobacco or illicit drugs  Current medications and supplements including opioid prescriptions. Patient is not currently taking opioid prescriptions. Functional ability and status Nutritional status Physical activity Advanced directives List of other physicians Hospitalizations, surgeries, and ER visits in previous 12 months Vitals Screenings to include cognitive, depression, and falls Referrals and appointments  In addition, I have reviewed and discussed with patient certain preventive protocols, quality metrics, and best practice recommendations. A written personalized care plan for preventive services as well as general preventive health recommendations were provided to patient.     Mary-Margaret Gladis, [place] 07/07/2023

## 2023-07-07 NOTE — Patient Instructions (Signed)
What are Advance Directives? ?A living will allows you to document your wishes concerning medical treatments at the end of life.  ? ?Before your living will can guide medical decision-making two physicians must certify: ?You are unable to make medical decisions,  ?You are in the medical condition specified in the state's living will law (such as "terminal illness" or "permanent unconsciousness"),  ?Other requirements also may apply, depending upon the state. ?A medical power of attorney (or healthcare proxy) allows you to appoint a person you trust as your healthcare agent (or surrogate decision maker), who is authorized to make medical decisions on your behalf.  ? ?Before a medical power of attorney goes into effect a person?s physician must conclude that they are unable to make their own medical decisions. In addition: ?If a person regains the ability to make decisions, the agent cannot continue to act on the person's behalf.  ?Many states have additional requirements that apply only to decisions about life-sustaining medical treatments.  ?For example, before your agent can refuse a life-sustaining treatment on your behalf, a second physician may have to confirm your doctor's assessment that you are incapable of making treatment decisions. ?What Else Do I Need to Know?  ?Advance directives are legally valid throughout the [place]. While you do not need a lawyer to fill out an advance directive, your advance directive becomes legally valid as soon as you sign them in front of the required witnesses. The laws governing advance directives vary from state to state, so it is important to complete and sign advance directives that comply with your state's law. Also, advance directives can have different titles in different states.  ?Emergency medical technicians cannot honor living wills or medical powers of attorney. Once emergency personnel have been called, they must do what is necessary to stabilize a person  for transfer to a hospital, both from accident sites and from a home or other facility. After a physician fully evaluates the person's condition and determines the underlying conditions, advance directives can be implemented.  ?One state?s advance directive does not always work in another state. Some states do honor advance directives from another state; others will honor out-of-state advance directives as long as they are similar to the state's own law; and some states do not have an answer to this question. The best solution is if you spend a significant amount of time in more than one state, you should complete the advance directives for all the states you spend a significant amount of time in.  ?Advance directives do not expire. An advance directive remains in effect until you change it. If you complete a new advance directive, it invalidates the previous one.  ?You should review your advance directives periodically to ensure that they still reflect your wishes. If you want to change anything in an advance directive once you have completed it, you should complete a whole new document. ?[place] ? ? ? ?[facility], www.nhpco.org ? ?

## 2023-08-23 ENCOUNTER — Other Ambulatory Visit: Payer: Self-pay | Admitting: Student

## 2023-08-23 DIAGNOSIS — R109 Unspecified abdominal pain: Secondary | ICD-10-CM

## 2023-08-30 ENCOUNTER — Inpatient Hospital Stay
Admission: RE | Admit: 2023-08-30 | Discharge: 2023-08-30 | Discharge status: Home / Self Care | Source: Ambulatory Visit | Attending: Student

## 2023-08-30 DIAGNOSIS — R109 Unspecified abdominal pain: Secondary | ICD-10-CM

## 2023-12-30 ENCOUNTER — Ambulatory Visit: Payer: Self-pay | Admitting: Surgery

## 2023-12-30 NOTE — Progress Notes (Signed)
 "   Evan Baker [id number]   Referring Provider:  Thigpen, Jacqueline   Subjective   Chief Complaint: New Consultation     History of Present Illness:    Very pleasant 67 year old male presents with a left inguinal hernia.  This has been present for almost a year and it was initially asymptomatic but lately causes pain, he feels pressure when he sleeps on his side and notices protrusion that is worse throughout the day when he is up moving around and working which subsides when he lies down.  Denies any obstructive symptoms.  No previous abdominal surgery.   Review of Systems: A complete review of systems was obtained from the patient.  I have reviewed this information and discussed as appropriate with the patient.  See HPI as well for other ROS.   Medical History: Past Medical History:  Diagnosis Date   GERD (gastroesophageal reflux disease)     There is no problem list on file for this patient.   Past Surgical History:  Procedure Laterality Date   SPINE SURGERY       Allergies  Allergen Reactions   Levofloxacin  Other (See Comments)    Tendonitis    No current outpatient medications on file prior to visit.   No current facility-administered medications on file prior to visit.    No family history on file.   Social History   Tobacco Use  Smoking Status Never  Smokeless Tobacco Never     Social History   Socioeconomic History   Marital status: Widowed  Tobacco Use   Smoking status: Never   Smokeless tobacco: Never  Substance and Sexual Activity   Alcohol use: Never   Drug use: Never   Social Drivers of Corporate Investment Banker Strain: Low Risk (07/07/2023)   Received from [facility]   Overall Financial Resource Strain (CARDIA)    How hard is it for you to pay for the very basics like food, housing, medical care, and heating?: Not hard at all  Food Insecurity: No Food Insecurity (07/07/2023)   Received from [facility]   Hunger  Vital Sign    Within the past 12 months, you worried that your food would run out before you got the money to buy more.: Never true    Within the past 12 months, the food you bought just didn't last and you didn't have money to get more.: Never true  Transportation Needs: No Transportation Needs (07/07/2023)   Received from [facility] - Transportation    In the past 12 months, has lack of transportation kept you from medical appointments or from getting medications?: No    In the past 12 months, has lack of transportation kept you from meetings, work, or from getting things needed for daily living?: No  Physical Activity: Sufficiently Active (07/07/2023)   Received from [place]   Exercise Vital Sign    On average, how many days per week do you engage in moderate to strenuous exercise (like a brisk walk)?: 5 days    On average, how many minutes do you engage in exercise at this level?: 30 min  Stress: No Stress Concern Present (07/07/2023)   Received from [facility] - Occupational Stress Questionnaire    Do you feel stress - tense, restless, nervous, or anxious, or unable to sleep at night because your mind is troubled all the time - these days?: Not at all  Social  Connections: Socially Isolated (07/07/2023)   Received from [facility]   Social Connection and Isolation Panel    In a typical week, how many times do you talk on the phone with family, friends, or neighbors?: Three times a week    How often do you get together with friends or relatives?: Once a week    How often do you attend church or religious services?: Never    Do you belong to any clubs or organizations such as church groups, unions, fraternal or athletic groups, or school groups?: No    How often do you attend meetings of the clubs or organizations you belong to?: Never    Are you married, widowed, divorced, separated, never married, or living with a  partner?: Widowed    Objective:    Vitals:   12/30/23 1009  BP: 135/84  Pulse: 66  Temp: 37.1 C (98.7 F)  SpO2: 99%  Weight: 72.9 kg (160 lb 12.8 oz)  Height: 172.7 cm (5' 8)  PainSc: 0-No pain    Body mass index is 24.45 kg/m.  Gen: A&Ox3, no distress  Chest: respiratory effort is normal. Abdomen: soft, nondistended, nontender.  Reducible left inguinal hernia which is mildly tender.  No hernia on the right. Neuro: no gross deficit Psych: appropriate mood and affect, normal insight/judgment intact  Skin: warm and dry  Assessment and Plan:  Diagnoses and all orders for this visit:  Non-recurrent unilateral inguinal hernia without obstruction or gangrene    We discussed the relevant anatomy and we discussed options for repair.  I recommend an open approach and went over the technique of the procedure.  Discussed risks of bleeding, infection, pain, scarring, injury to structures in the area including nerves, blood vessels, bowel, or bladder; risk of chronic pain, hernia recurrence, risk of seroma or hematoma, urinary retention, and risks of general anesthesia including cardiovascular, pulmonary, and thromboembolic complications.  We discussed typical postop recovery, timeline, and activity limitations.  We also discussed the option of ongoing observation, with high rate of ultimately returning for surgery and risk of increasing size/symptoms from the hernia as well as incarceration/strangulation and went over symptoms that should prompt the patient to seek emergency treatment.  Questions were welcomed and answered to the patient's satisfaction. Patient wishes to proceed with scheduling.     Mitzie Freund MD FACS   "

## 2023-12-30 NOTE — H&P (View-Only) (Signed)
 "   Evan Baker [id number]   Referring Provider:  Thigpen, Jacqueline   Subjective   Chief Complaint: New Consultation     History of Present Illness:    Very pleasant 67 year old male presents with a left inguinal hernia.  This has been present for almost a year and it was initially asymptomatic but lately causes pain, he feels pressure when he sleeps on his side and notices protrusion that is worse throughout the day when he is up moving around and working which subsides when he lies down.  Denies any obstructive symptoms.  No previous abdominal surgery.   Review of Systems: A complete review of systems was obtained from the patient.  I have reviewed this information and discussed as appropriate with the patient.  See HPI as well for other ROS.   Medical History: Past Medical History:  Diagnosis Date   GERD (gastroesophageal reflux disease)     There is no problem list on file for this patient.   Past Surgical History:  Procedure Laterality Date   SPINE SURGERY       Allergies  Allergen Reactions   Levofloxacin  Other (See Comments)    Tendonitis    No current outpatient medications on file prior to visit.   No current facility-administered medications on file prior to visit.    No family history on file.   Social History   Tobacco Use  Smoking Status Never  Smokeless Tobacco Never     Social History   Socioeconomic History   Marital status: Widowed  Tobacco Use   Smoking status: Never   Smokeless tobacco: Never  Substance and Sexual Activity   Alcohol use: Never   Drug use: Never   Social Drivers of Corporate Investment Banker Strain: Low Risk (07/07/2023)   Received from [facility]   Overall Financial Resource Strain (CARDIA)    How hard is it for you to pay for the very basics like food, housing, medical care, and heating?: Not hard at all  Food Insecurity: No Food Insecurity (07/07/2023)   Received from [facility]   Hunger  Vital Sign    Within the past 12 months, you worried that your food would run out before you got the money to buy more.: Never true    Within the past 12 months, the food you bought just didn't last and you didn't have money to get more.: Never true  Transportation Needs: No Transportation Needs (07/07/2023)   Received from [facility] - Transportation    In the past 12 months, has lack of transportation kept you from medical appointments or from getting medications?: No    In the past 12 months, has lack of transportation kept you from meetings, work, or from getting things needed for daily living?: No  Physical Activity: Sufficiently Active (07/07/2023)   Received from [facility]   Exercise Vital Sign    On average, how many days per week do you engage in moderate to strenuous exercise (like a brisk walk)?: 5 days    On average, how many minutes do you engage in exercise at this level?: 30 min  Stress: No Stress Concern Present (07/07/2023)   Received from [facility] Occupational [facility] - Occupational Stress Questionnaire    Do you feel stress - tense, restless, nervous, or anxious, or unable to sleep at night because your mind is troubled all the time - these days?: Not at all  Social  Connections: Socially Isolated (07/07/2023)   Received from [facility]   Social Connection and Isolation Panel    In a typical week, how many times do you talk on the phone with family, friends, or neighbors?: Three times a week    How often do you get together with friends or relatives?: Once a week    How often do you attend church or religious services?: Never    Do you belong to any clubs or organizations such as church groups, unions, fraternal or athletic groups, or school groups?: No    How often do you attend meetings of the clubs or organizations you belong to?: Never    Are you married, widowed, divorced, separated, never married, or living with a  partner?: Widowed    Objective:    Vitals:   12/30/23 1009  BP: 135/84  Pulse: 66  Temp: 37.1 C (98.7 F)  SpO2: 99%  Weight: 72.9 kg (160 lb 12.8 oz)  Height: 172.7 cm (5' 8)  PainSc: 0-No pain    Body mass index is 24.45 kg/m.  Gen: A&Ox3, no distress  Chest: respiratory effort is normal. Abdomen: soft, nondistended, nontender.  Reducible left inguinal hernia which is mildly tender.  No hernia on the right. Neuro: no gross deficit Psych: appropriate mood and affect, normal insight/judgment intact  Skin: warm and dry  Assessment and Plan:  Diagnoses and all orders for this visit:  Non-recurrent unilateral inguinal hernia without obstruction or gangrene    We discussed the relevant anatomy and we discussed options for repair.  I recommend an open approach and went over the technique of the procedure.  Discussed risks of bleeding, infection, pain, scarring, injury to structures in the area including nerves, blood vessels, bowel, or bladder; risk of chronic pain, hernia recurrence, risk of seroma or hematoma, urinary retention, and risks of general anesthesia including cardiovascular, pulmonary, and thromboembolic complications.  We discussed typical postop recovery, timeline, and activity limitations.  We also discussed the option of ongoing observation, with high rate of ultimately returning for surgery and risk of increasing size/symptoms from the hernia as well as incarceration/strangulation and went over symptoms that should prompt the patient to seek emergency treatment.  Questions were welcomed and answered to the patient's satisfaction. Patient wishes to proceed with scheduling.     Mitzie Freund MD FACS   "

## 2023-12-30 NOTE — H&P (Signed)
 "   Evan Baker [id number]   Referring Provider:  Thigpen, Jacqueline   Subjective   Chief Complaint: New Consultation     History of Present Illness:    Very pleasant 67 year old male presents with a left inguinal hernia.  This has been present for almost a year and it was initially asymptomatic but lately causes pain, he feels pressure when he sleeps on his side and notices protrusion that is worse throughout the day when he is up moving around and working which subsides when he lies down.  Denies any obstructive symptoms.  No previous abdominal surgery.   Review of Systems: A complete review of systems was obtained from the patient.  I have reviewed this information and discussed as appropriate with the patient.  See HPI as well for other ROS.   Medical History: Past Medical History:  Diagnosis Date   GERD (gastroesophageal reflux disease)     There is no problem list on file for this patient.   Past Surgical History:  Procedure Laterality Date   SPINE SURGERY       Allergies  Allergen Reactions   Levofloxacin  Other (See Comments)    Tendonitis    No current outpatient medications on file prior to visit.   No current facility-administered medications on file prior to visit.    No family history on file.   Social History   Tobacco Use  Smoking Status Never  Smokeless Tobacco Never     Social History   Socioeconomic History   Marital status: Widowed  Tobacco Use   Smoking status: Never   Smokeless tobacco: Never  Substance and Sexual Activity   Alcohol use: Never   Drug use: Never   Social Drivers of Corporate Investment Banker Strain: Low Risk (07/07/2023)   Received from [facility]   Overall Financial Resource Strain (CARDIA)    How hard is it for you to pay for the very basics like food, housing, medical care, and heating?: Not hard at all  Food Insecurity: No Food Insecurity (07/07/2023)   Received from [facility]   Hunger  Vital Sign    Within the past 12 months, you worried that your food would run out before you got the money to buy more.: Never true    Within the past 12 months, the food you bought just didn't last and you didn't have money to get more.: Never true  Transportation Needs: No Transportation Needs (07/07/2023)   Received from [facility] - Transportation    In the past 12 months, has lack of transportation kept you from medical appointments or from getting medications?: No    In the past 12 months, has lack of transportation kept you from meetings, work, or from getting things needed for daily living?: No  Physical Activity: Sufficiently Active (07/07/2023)   Received from [facility]   Exercise Vital Sign    On average, how many days per week do you engage in moderate to strenuous exercise (like a brisk walk)?: 5 days    On average, how many minutes do you engage in exercise at this level?: 30 min  Stress: No Stress Concern Present (07/07/2023)   Received from [facility] of Occupational Health - Occupational Stress Questionnaire    Do you feel stress - tense, restless, nervous, or anxious, or unable to sleep at night because your mind is troubled all the time - these days?: Not at all  Social  Connections: Socially Isolated (07/07/2023)   Received from [facility]   Social Connection and Isolation Panel    In a typical week, how many times do you talk on the phone with family, friends, or neighbors?: Three times a week    How often do you get together with friends or relatives?: Once a week    How often do you attend church or religious services?: Never    Do you belong to any clubs or organizations such as church groups, unions, fraternal or athletic groups, or school groups?: No    How often do you attend meetings of the clubs or organizations you belong to?: Never    Are you married, widowed, divorced, separated, never married, or living with a  partner?: Widowed    Objective:    Vitals:   12/30/23 1009  BP: 135/84  Pulse: 66  Temp: 37.1 C (98.7 F)  SpO2: 99%  Weight: 72.9 kg (160 lb 12.8 oz)  Height: 172.7 cm (5' 8)  PainSc: 0-No pain    Body mass index is 24.45 kg/m.  Gen: A&Ox3, no distress  Chest: respiratory effort is normal. Abdomen: soft, nondistended, nontender.  Reducible left inguinal hernia which is mildly tender.  No hernia on the right. Neuro: no gross deficit Psych: appropriate mood and affect, normal insight/judgment intact  Skin: warm and dry  Assessment and Plan:  Diagnoses and all orders for this visit:  Non-recurrent unilateral inguinal hernia without obstruction or gangrene    We discussed the relevant anatomy and we discussed options for repair.  I recommend an open approach and went over the technique of the procedure.  Discussed risks of bleeding, infection, pain, scarring, injury to structures in the area including nerves, blood vessels, bowel, or bladder; risk of chronic pain, hernia recurrence, risk of seroma or hematoma, urinary retention, and risks of general anesthesia including cardiovascular, pulmonary, and thromboembolic complications.  We discussed typical postop recovery, timeline, and activity limitations.  We also discussed the option of ongoing observation, with high rate of ultimately returning for surgery and risk of increasing size/symptoms from the hernia as well as incarceration/strangulation and went over symptoms that should prompt the patient to seek emergency treatment.  Questions were welcomed and answered to the patient's satisfaction. Patient wishes to proceed with scheduling.     Mitzie Freund MD FACS   "

## 2024-01-01 NOTE — Progress Notes (Signed)
 COVID Vaccine received:  []  No [x]  Yes Date of any COVID positive Test in last 90 days:  None  PCP - Ronal Rollene Lunger, FNP Cardiologist - none  Chest x-ray - 08-05-2022  2v Epic EKG - 07-07-2023  Stress Test - 2013 Epic ECHO - 2013 Epic Cardiac Cath -  CT Coronary Calcium score:   Pacemaker / ICD device [x]  No []  Yes   Spinal Cord Stimulator:[x]  No []  Yes       History of Sleep Apnea? [x]  No []  Yes   CPAP used?- [x]  No []  Yes    Medication on DOS: famotidine  Patient has: [x]  NO Hx DM   []  Pre-DM   []  DM1  []   DM2 Does the patient monitor blood sugar?   [x]  N/A   []  No []  Yes   Blood Thinner / Instructions:  none Aspirin Instructions:  none  Activity level: Able to walk up 2 flights of stairs without becoming significantly short of breath or having chest pain?   [x]    Yes   []  No,  would have:  Patient can perform ADLs without assistance.  [x]   Yes    []  No   Anesthesia review: PVC, GERD,   Patient denies any S&S of respiratory illness or Covid - no shortness of breath, fever, cough or chest pain at PAT appointment.  Patient verbalized understanding and agreement to the Pre-Surgical Instructions that were given to them at this PAT appointment. Patient was also educated of the need to review these PAT instructions again prior to his surgery.I reviewed the appropriate phone numbers to call if they have any and questions or concerns.

## 2024-01-03 NOTE — Patient Instructions (Signed)
 SURGICAL WAITING ROOM VISITATION Patients having surgery or a procedure may have no more than 2 support people in the waiting area - these visitors may rotate in the visitor waiting room.   If the patient needs to stay at the hospital during part of their recovery, the visitor guidelines for inpatient rooms apply.  PRE-OP VISITATION  Pre-op nurse will coordinate an appropriate time for 1 support person to accompany the patient in pre-op.  This support person may not rotate.  This visitor will be contacted when the time is appropriate for the visitor to come back in the pre-op area.  Temporary Visitor Restrictions   Children ages 22 and under will not be able to visit patients in [facility] under most circumstances. Visitation is not restricted outside of hospitals unless noted otherwise in the [facility] and Location Specific Visitation Guidelines at :       [email or web address]  Visitors with respiratory illnesses are discouraged from visiting and should remain at home.  You are not required to quarantine at this time prior to your surgery. However, you must do this: Hand Hygiene often Do NOT share personal items Notify your provider if you are in close contact with someone who has COVID or you develop fever 100.4 or greater, new onset of sneezing, cough, sore throat, shortness of breath or body aches.  If you test positive for Covid or have been in contact with anyone that has tested positive in the last 10 days please notify you surgeon.    Your procedure is scheduled on:  [facility]  01-18-2024    Report to [facility] Main Entrance: [address] entrance where the [employer] is available.   Report to admitting at: 08:00   AM  Call this number if you have any questions or problems the morning of surgery [phone]  FOLLOW ANY ADDITIONAL PRE OP INSTRUCTIONS YOU RECEIVED FROM YOUR SURGEON'S OFFICE!!!  Do not eat food after Midnight the night prior to your  surgery/procedure.  After Midnight you may have the following liquids until  07:15  AM DAY OF SURGERY  Clear Liquid Diet Water Black Coffee (sugar ok, NO MILK/CREAM OR CREAMERS)  Tea (sugar ok, NO MILK/CREAM OR CREAMERS) regular and decaf                             Plain Jell-O  with no fruit (NO RED)                                           Fruit ices (not with fruit pulp, NO RED)                                     Popsicles (NO RED)                                                                  Juice: NO CITRUS JUICES: only apple, WHITE grape, WHITE cranberry Sports drinks like Gatorade or Powerade (NO RED)  Oral Hygiene is also important to reduce your risk of infection.        Remember - BRUSH YOUR TEETH THE MORNING OF SURGERY WITH YOUR REGULAR TOOTHPASTE  Do NOT smoke after Midnight the night before surgery.  STOP TAKING all Vitamins, Herbs and supplements 1 week before your surgery.   Take ONLY these medicines the morning of surgery with A SIP OF WATER:  Famotidine                   You may not have any metal on your body including  jewelry, and body piercing  Do not wear  lotions, powders, cologne, or deodorant  Men may shave face and neck.  Contacts, Hearing Aids, dentures or bridgework may not be worn into surgery. DENTURES WILL BE REMOVED PRIOR TO SURGERY PLEASE DO NOT APPLY Poly grip OR ADHESIVES!!!  Patients discharged on the day of surgery will not be allowed to drive home.  Someone NEEDS to stay with you for the first 24 hours after anesthesia.  Do not bring your home medications to the hospital. The Pharmacy will dispense medications listed on your medication list to you during your admission in the Hospital.  Please read over the following fact sheets you were given: IF YOU HAVE QUESTIONS ABOUT YOUR PRE-OP INSTRUCTIONS, PLEASE CALL [phone]   [facility] - Preparing for Surgery        Before surgery, you can play an important role.   Because skin is not sterile, your skin needs to be as free of germs as possible.  You can reduce the number of germs on your skin by washing with CHG (chlorahexidine gluconate) soap before surgery.  CHG is an antiseptic cleaner which kills germs and bonds with the skin to continue killing germs even after washing. Please DO NOT use if you have an allergy to CHG or antibacterial soaps.  If your skin becomes reddened/irritated stop using the CHG and inform your nurse when you arrive at Short Stay. Do not shave (including legs and underarms) for at least 48 hours prior to the first CHG shower.  You may shave your face/neck.  Please follow these instructions carefully:  1.  Shower with CHG Soap the night before surgery ONLY (DO NOT USE THE CHG SOAP THE MORNING OF SURGERY).  2.  If you choose to wash your hair, wash your hair first as usual with your normal  shampoo.  3.  After you shampoo, rinse your hair and body thoroughly to remove the shampoo.                             4.  Use CHG as you would any other liquid soap.  You can apply chg directly to the skin and wash.  Gently with a scrungie or clean washcloth.  5.  Apply the CHG Soap to your body ONLY FROM THE NECK DOWN.   Do not use on face/ open                           Wound or open sores. Avoid contact with eyes, ears mouth and genitals (private parts).                       Wash face,  Genitals (private parts) with your normal soap.             6.  Wash thoroughly, paying special attention to the area where your  surgery  will be performed.  7.  Thoroughly rinse your body with warm water from the neck down.  8.  DO NOT shower/wash with your normal soap after using and rinsing off the CHG Soap.                9.  Pat yourself dry with a clean towel.            10.  Wear clean pajamas.            11.  Place clean sheets on your bed the night of your first shower and do not  sleep with pets.  Day of Surgery : Do not apply any CHG,  lotions/deodorants the morning of surgery.  Please wear clean clothes to the hospital/surgery center.   FAILURE TO FOLLOW THESE INSTRUCTIONS MAY RESULT IN THE CANCELLATION OF YOUR SURGERY  PATIENT SIGNATURE_________________________________  NURSE SIGNATURE__________________________________  ________________________________________________________________________

## 2024-01-04 ENCOUNTER — Encounter (HOSPITAL_COMMUNITY): Payer: Self-pay

## 2024-01-04 ENCOUNTER — Other Ambulatory Visit: Payer: Self-pay

## 2024-01-04 ENCOUNTER — Encounter (HOSPITAL_COMMUNITY)
Admission: RE | Admit: 2024-01-04 | Discharge: 2024-01-04 | Disposition: A | Discharge status: Home / Self Care | Source: Ambulatory Visit | Attending: Surgery | Admitting: Surgery

## 2024-01-04 VITALS — BP 127/75 | HR 59 | Temp 97.9°F | Resp 14 | Ht 68.0 in | Wt 160.0 lb

## 2024-01-04 DIAGNOSIS — K219 Gastro-esophageal reflux disease without esophagitis: Secondary | ICD-10-CM | POA: Diagnosis not present

## 2024-01-04 DIAGNOSIS — Z01812 Encounter for preprocedural laboratory examination: Secondary | ICD-10-CM | POA: Insufficient documentation

## 2024-01-04 DIAGNOSIS — I493 Ventricular premature depolarization: Secondary | ICD-10-CM | POA: Diagnosis not present

## 2024-01-04 DIAGNOSIS — Z01818 Encounter for other preprocedural examination: Secondary | ICD-10-CM

## 2024-01-04 HISTORY — DX: Unspecified osteoarthritis, unspecified site: M19.90

## 2024-01-04 HISTORY — DX: Nausea with vomiting, unspecified: R11.2

## 2024-01-04 HISTORY — DX: Malignant (primary) neoplasm, unspecified: C80.1

## 2024-01-04 LAB — BASIC METABOLIC PANEL WITH GFR
Anion gap: 8 (ref 5–15)
BUN: 12 mg/dL (ref 8–23)
CO2: 26 mmol/L (ref 22–32)
Calcium: 9.3 mg/dL (ref 8.9–10.3)
Chloride: 105 mmol/L (ref 98–111)
Creatinine, Ser: 1 mg/dL (ref 0.61–1.24)
GFR, Estimated: >60 mL/min
Glucose, Bld: 69 mg/dL — ABNORMAL LOW (ref 70–99)
Potassium: 4.5 mmol/L (ref 3.5–5.1)
Sodium: 140 mmol/L (ref 135–145)

## 2024-01-04 LAB — CBC
HCT: 44.1 % (ref 39.0–52.0)
Hemoglobin: 15 g/dL (ref 13.0–17.0)
MCH: 31.4 pg (ref 26.0–34.0)
MCHC: 34 g/dL (ref 30.0–36.0)
MCV: 92.5 fL (ref 80.0–100.0)
Platelets: 313 K/uL (ref 150–400)
RBC: 4.77 MIL/uL (ref 4.22–5.81)
RDW: 14.8 % (ref 11.5–15.5)
WBC: 7.5 K/uL (ref 4.0–10.5)
nRBC: 0 % (ref 0.0–0.2)

## 2024-01-18 ENCOUNTER — Ambulatory Visit (HOSPITAL_COMMUNITY): Admitting: Certified Registered"

## 2024-01-18 ENCOUNTER — Ambulatory Visit (HOSPITAL_COMMUNITY)
Admission: RE | Admit: 2024-01-18 | Discharge: 2024-01-18 | Disposition: A | Discharge status: Home / Self Care | Source: Ambulatory Visit | Attending: Surgery | Admitting: Surgery

## 2024-01-18 ENCOUNTER — Other Ambulatory Visit: Payer: Self-pay

## 2024-01-18 ENCOUNTER — Encounter (HOSPITAL_COMMUNITY)
Admission: RE | Disposition: A | Discharge status: Home / Self Care | Payer: Self-pay | Source: Ambulatory Visit | Attending: Surgery

## 2024-01-18 ENCOUNTER — Encounter (HOSPITAL_COMMUNITY): Payer: Self-pay | Admitting: Surgery

## 2024-01-18 DIAGNOSIS — K4090 Unilateral inguinal hernia, without obstruction or gangrene, not specified as recurrent: Secondary | ICD-10-CM | POA: Insufficient documentation

## 2024-01-18 DIAGNOSIS — K219 Gastro-esophageal reflux disease without esophagitis: Secondary | ICD-10-CM | POA: Insufficient documentation

## 2024-01-18 DIAGNOSIS — M1990 Unspecified osteoarthritis, unspecified site: Secondary | ICD-10-CM | POA: Diagnosis not present

## 2024-01-18 HISTORY — PX: INGUINAL HERNIA REPAIR: SHX194

## 2024-01-18 MED ORDER — ONDANSETRON HCL 4 MG/2ML IJ SOLN
INTRAMUSCULAR | Status: DC | PRN
  Administered 2024-01-18: 4 mg via INTRAVENOUS

## 2024-01-18 MED ORDER — CEFAZOLIN SODIUM-DEXTROSE 2-4 GM/100ML-% IV SOLN
2.0000 g | INTRAVENOUS | Status: AC
  Administered 2024-01-18: 2 g via INTRAVENOUS
  Filled 2024-01-18: qty 100

## 2024-01-18 MED ORDER — CHLORHEXIDINE GLUCONATE 0.12 % MT SOLN
15.0000 mL | Freq: Once | OROMUCOSAL | Status: AC
  Administered 2024-01-18: 15 mL via OROMUCOSAL

## 2024-01-18 MED ORDER — FENTANYL CITRATE (PF) 100 MCG/2ML IJ SOLN
INTRAMUSCULAR | Status: DC | PRN
  Administered 2024-01-18: 50 ug via INTRAVENOUS

## 2024-01-18 MED ORDER — FENTANYL CITRATE (PF) 50 MCG/ML IJ SOSY: 25.0000 ug | PREFILLED_SYRINGE | INTRAMUSCULAR | Status: DC | PRN

## 2024-01-18 MED ORDER — BUPIVACAINE LIPOSOME 1.3 % IJ SUSP: 20.0000 mL | Freq: Once | INTRAMUSCULAR | Status: DC

## 2024-01-18 MED ORDER — LIDOCAINE HCL (PF) 2 % IJ SOLN
INTRAMUSCULAR | Status: DC | PRN
  Administered 2024-01-18: 60 mg via INTRADERMAL

## 2024-01-18 MED ORDER — PROPOFOL 1000 MG/100ML IV EMUL
INTRAVENOUS | Status: AC
  Filled 2024-01-18: qty 100

## 2024-01-18 MED ORDER — LACTATED RINGERS IV SOLN: INTRAVENOUS | Status: DC

## 2024-01-18 MED ORDER — DOCUSATE SODIUM 100 MG PO CAPS: 100.0000 mg | ORAL_CAPSULE | Freq: Two times a day (BID) | ORAL | 0 refills | Status: AC

## 2024-01-18 MED ORDER — CHLORHEXIDINE GLUCONATE 4 % EX SOLN: 60.0000 mL | Freq: Once | CUTANEOUS | Status: DC

## 2024-01-18 MED ORDER — DEXAMETHASONE SOD PHOSPHATE PF 10 MG/ML IJ SOLN
INTRAMUSCULAR | Status: DC | PRN
  Administered 2024-01-18: 10 mg via INTRAVENOUS

## 2024-01-18 MED ORDER — ONDANSETRON HCL 4 MG/2ML IJ SOLN
INTRAMUSCULAR | Status: AC
  Filled 2024-01-18: qty 2

## 2024-01-18 MED ORDER — ORAL CARE MOUTH RINSE: 15.0000 mL | Freq: Once | OROMUCOSAL | Status: AC

## 2024-01-18 MED ORDER — TRAMADOL HCL 50 MG PO TABS: 50.0000 mg | ORAL_TABLET | Freq: Four times a day (QID) | ORAL | 0 refills | Status: AC | PRN

## 2024-01-18 MED ORDER — BUPIVACAINE-EPINEPHRINE (PF) 0.25% -1:200000 IJ SOLN
INTRAMUSCULAR | Status: DC | PRN
  Administered 2024-01-18: 10 mL
  Administered 2024-01-18: 40 mL

## 2024-01-18 MED ORDER — FENTANYL CITRATE (PF) 100 MCG/2ML IJ SOLN
INTRAMUSCULAR | Status: AC
  Filled 2024-01-18: qty 2

## 2024-01-18 MED ORDER — PROPOFOL 10 MG/ML IV BOLUS
INTRAVENOUS | Status: DC | PRN
  Administered 2024-01-18: 180 mg via INTRAVENOUS
  Administered 2024-01-18: 125 ug/kg/min via INTRAVENOUS

## 2024-01-18 MED ORDER — BUPIVACAINE-EPINEPHRINE (PF) 0.25% -1:200000 IJ SOLN
INTRAMUSCULAR | Status: AC
  Filled 2024-01-18: qty 30

## 2024-01-18 MED ORDER — 0.9 % SODIUM CHLORIDE (POUR BTL) OPTIME
TOPICAL | Status: DC | PRN
  Administered 2024-01-18: 1000 mL

## 2024-01-18 MED ORDER — DEXAMETHASONE SOD PHOSPHATE PF 10 MG/ML IJ SOLN: INTRAMUSCULAR | Status: DC | PRN

## 2024-01-18 MED ORDER — PROPOFOL 10 MG/ML IV BOLUS
INTRAVENOUS | Status: AC
  Filled 2024-01-18: qty 20

## 2024-01-18 MED ORDER — DEXAMETHASONE SOD PHOSPHATE PF 10 MG/ML IJ SOLN
INTRAMUSCULAR | Status: AC
  Filled 2024-01-18: qty 1

## 2024-01-18 MED ORDER — GABAPENTIN 300 MG PO CAPS
300.0000 mg | ORAL_CAPSULE | ORAL | Status: AC
  Administered 2024-01-18: 300 mg via ORAL
  Filled 2024-01-18: qty 1

## 2024-01-18 MED ORDER — BUPIVACAINE LIPOSOME 1.3 % IJ SUSP
INTRAMUSCULAR | Status: AC
  Filled 2024-01-18: qty 20

## 2024-01-18 MED ORDER — LIDOCAINE HCL 1 % IJ SOLN
INTRAMUSCULAR | Status: AC
  Filled 2024-01-18: qty 20

## 2024-01-18 MED ORDER — ACETAMINOPHEN 500 MG PO TABS
1000.0000 mg | ORAL_TABLET | ORAL | Status: AC
  Administered 2024-01-18: 1000 mg via ORAL
  Filled 2024-01-18: qty 2

## 2024-01-18 MED ORDER — PHENYLEPHRINE HCL-NACL 20-0.9 MG/250ML-% IV SOLN
INTRAVENOUS | Status: AC
  Filled 2024-01-18: qty 250

## 2024-01-18 MED ORDER — LIDOCAINE HCL (PF) 2 % IJ SOLN
INTRAMUSCULAR | Status: AC
  Filled 2024-01-18: qty 5

## 2024-01-18 MED ORDER — DROPERIDOL 2.5 MG/ML IJ SOLN: 0.6250 mg | Freq: Once | INTRAMUSCULAR | Status: DC | PRN

## 2024-01-18 NOTE — Anesthesia Procedure Notes (Signed)
 Procedure Name: LMA Insertion Date/Time: 01/18/2024 10:04 AM  Performed by: Para Jerelene CROME, CRNAPre-anesthesia Checklist: Patient identified, Emergency Drugs available, Suction available and Patient being monitored Patient Re-evaluated:Patient Re-evaluated prior to induction Oxygen Delivery Method: Circle system utilized Preoxygenation: Pre-oxygenation with 100% oxygen Induction Type: IV induction Ventilation: Mask ventilation without difficulty LMA: LMA inserted LMA Size: 4.0 Tube type: Oral Number of attempts: 1 Placement Confirmation: positive ETCO2, CO2 detector and breath sounds checked- equal and bilateral ETT to lip (cm): LMA secured once seated properly. No markings on Ambu Aurastraight LMA size 4. Tube secured with: Tape Dental Injury: Teeth and Oropharynx as per pre-operative assessment  Comments: Atraumatic insertion x 1. Lips and teeth remain in preoperative condition.

## 2024-01-18 NOTE — Interval H&P Note (Signed)
 History and Physical Interval Note:  01/18/2024 8:33 AM  Evan Baker  has presented today for surgery, with the diagnosis of HERNIA.  The various methods of treatment have been discussed with the patient and family. After consideration of risks, benefits and other options for treatment, the patient has consented to  Procedures with comments: REPAIR, HERNIA, INGUINAL, ADULT (Left) - OPEN LEFT INIGUINAL HERNIA REPAIR WITH MESH as a surgical intervention.  The patient's history has been reviewed, patient examined, no change in status, stable for surgery.  I have reviewed the patient's chart and labs.  Questions were answered to the patient's satisfaction.     [name] DELENA Freund

## 2024-01-18 NOTE — Anesthesia Postprocedure Evaluation (Signed)
"   Anesthesia Post Note  Patient: Evan Baker  Procedure(s) Performed: REPAIR, HERNIA, INGUINAL, ADULT (Left: Inguinal)     Patient location during evaluation: PACU Anesthesia Type: General Level of consciousness: awake and alert Pain management: pain level controlled Vital Signs Assessment: post-procedure vital signs reviewed and stable Respiratory status: spontaneous breathing, nonlabored ventilation, respiratory function stable and patient connected to nasal cannula oxygen Cardiovascular status: blood pressure returned to baseline and stable Postop Assessment: no apparent nausea or vomiting Anesthetic complications: no   No notable events documented.  Last Vitals:  Vitals:   01/18/24 1230 01/18/24 1245  BP: 131/78 136/82  Pulse: (!) 59 (!) 56  Resp: 19 17  Temp:    SpO2: 98% 100%    Last Pain:  Vitals:   01/18/24 1245  TempSrc:   PainSc: 0-No pain                 Cordella P Cherisse Carrell      "

## 2024-01-18 NOTE — Anesthesia Preprocedure Evaluation (Addendum)
 "                                  Anesthesia Evaluation  Patient identified by MRN, date of birth, ID band Patient awake    Reviewed: Allergy & Precautions, NPO status , Patient's Chart, lab work & pertinent test results  History of Anesthesia Complications (+) PONV and history of anesthetic complications  Airway Mallampati: II  TM Distance: >3 FB Neck ROM: Full    Dental no notable dental hx.    Pulmonary neg pulmonary ROS   Pulmonary exam normal        Cardiovascular negative cardio ROS  Rhythm:Regular Rate:Normal     Neuro/Psych negative neurological ROS  negative psych ROS   GI/Hepatic Neg liver ROS,GERD  ,,Inguinal hernia    Endo/Other  negative endocrine ROS    Renal/GU negative Renal ROS  negative genitourinary   Musculoskeletal  (+) Arthritis ,    Abdominal Normal abdominal exam  (+)   Peds  Hematology Lab Results      Component                Value               Date                      WBC                      7.5                 01/04/2024                HGB                      15.0                01/04/2024                HCT                      44.1                01/04/2024                MCV                      92.5                01/04/2024                PLT                      313                 01/04/2024             Lab Results      Component                Value               Date                      NA                       140  01/04/2024                K                        4.5                 01/04/2024                CO2                      26                  01/04/2024                GLUCOSE                  69 (L)              01/04/2024                BUN                      12                  01/04/2024                CREATININE               1.00                01/04/2024                CALCIUM                  9.3                 01/04/2024                EGFR                      67                  04/29/2020                GFRNONAA                 >60                 01/04/2024              Anesthesia Other Findings   Reproductive/Obstetrics                              Anesthesia Physical Anesthesia Plan  ASA: 2  Anesthesia Plan: General   Post-op Pain Management: Tylenol  PO (pre-op)* and Gabapentin  PO (pre-op)*   Induction: Intravenous  PONV Risk Score and Plan: 3 and Ondansetron , Dexamethasone , Treatment may vary due to age or medical condition, Propofol  infusion and TIVA  Airway Management Planned: Mask and LMA  Additional Equipment: None  Intra-op Plan:   Post-operative Plan: Extubation in OR  Informed Consent: I have reviewed the patients History and Physical, chart, labs and discussed the procedure including the risks, benefits and alternatives for the proposed anesthesia with the patient or authorized representative who has indicated his/her understanding and acceptance.     Dental advisory given  Plan Discussed with: CRNA  Anesthesia Plan Comments:  Anesthesia Quick Evaluation  "

## 2024-01-18 NOTE — Discharge Instructions (Signed)
HERNIA REPAIR: POST OP INSTRUCTIONS   EAT Gradually transition to a high fiber diet with a fiber supplement over the next few weeks after discharge.  Start with a pureed / full liquid diet (see below)  WALK Walk an hour a day (cumulative- not all at once).  Control your pain to do that.    CONTROL PAIN Control pain so that you can walk, sleep, tolerate sneezing/coughing, and go up/down stairs.  HAVE A BOWEL MOVEMENT DAILY Keep your bowels regular to avoid problems.  OK to try a laxative to override constipation.  OK to use an antidiarrheal to slow down diarrhea.  Call if not better after 2 tries  CALL IF YOU HAVE PROBLEMS/CONCERNS Call if you are still struggling despite following these instructions. Call if you have concerns not answered by these instructions  ######################################################################    DIET: Follow a light bland diet & liquids the first 24 hours after arrival home, such as soup, liquids, starches, etc.  Be sure to drink plenty of fluids.  Quickly advance to a usual solid diet within a few days.  Avoid fast food or heavy meals initially as you are more likely to get nauseated or have irregular bowels.  A low-sugar, high-fiber diet for the rest of your life is ideal.   Take your usually prescribed home medications unless otherwise directed.  PAIN CONTROL: Pain is best controlled by a usual combination of three different methods TOGETHER: Ice/Heat Over the counter pain medication Prescription pain medication Most patients will experience some swelling and bruising around the hernia(s) such as the bellybutton, groins, or old incisions.  Ice packs or heating pads (30-60 minutes up to 6 times a day) will help. Use ice for the first few days to help decrease swelling and bruising, then switch to heat to help relax tight/sore spots and speed recovery.  Some people prefer to use ice alone, heat alone, alternating between ice & heat.  Experiment  to what works for you.  Swelling and bruising can take several weeks to resolve.   It is helpful to take an over-the-counter pain medication regularly for the first days: Naproxen (Aleve, etc)  Two 220mg tabs twice a day OR Ibuprofen (Advil, etc) Three 200mg tabs four times a day (every meal & bedtime) AND Acetaminophen (Tylenol, etc) 325-650mg four times a day (every meal & bedtime) A  prescription for pain medication should be given to you upon discharge.  Take your pain medication as prescribed, IF NEEDED.  If you are having problems/concerns with the prescription medicine (does not control pain, nausea, vomiting, rash, itching, etc), please call us [phone] to see if we need to switch you to a different pain medicine that will work better for you and/or control your side effect better. If you need a refill on your pain medication, please contact your pharmacy.  They will contact our office to request authorization. Prescriptions will not be filled after 5 pm or on week-ends.  Avoid getting constipated.  Between the surgery and the pain medications, it is common to experience some constipation.  Increasing fluid intake and taking a fiber supplement (such as Metamucil, Citrucel, FiberCon, MiraLax, etc) 1-2 times a day regularly will usually help prevent this problem from occurring.  A mild laxative (prune juice, Milk of Magnesia, MiraLax, etc) should be taken according to package directions if there are no bowel movements after 48 hours.    Wash / shower every day, starting 2 days after surgery.  You may shower over   the steri strips or skin glue which are waterproof.  No rubbing, scrubbing, lotions or ointments to incision(s). Do not soak or submerge.   Remove your outer bandage 2 days after surgery. Steri strips (if present) will peel off after 1-2 weeks. Glue (if present) will flake off after about 2 weeks.  You may leave the incision open to air.  You may replace a dressing/Band-Aid to cover  an incision for comfort if you wish.  Continue to shower over incision(s) after the dressing is off.  ACTIVITIES as tolerated:   You may resume regular (light) daily activities beginning the next day--such as daily self-care, walking, climbing stairs--gradually increasing activities as tolerated.  Control your pain so that you can walk an hour a day.  If you can walk 30 minutes without difficulty, it is safe to try more intense activity such as jogging, treadmill, bicycling, low-impact aerobics, swimming, etc. Refrain from the most intensive and strenuous activity such as sit-ups, heavy lifting, contact sports, etc  Refrain from any heavy lifting or straining until 6 weeks after surgery.   DO NOT PUSH THROUGH PAIN.  Let pain be your guide: If it hurts to do something, don't do it.  Pain is your body warning you to avoid that activity for another week until the pain goes down. You may drive when you are no longer taking prescription pain medication, you can comfortably wear a seatbelt, and you can safely maneuver your car and apply brakes. You may have sexual intercourse when it is comfortable.   FOLLOW UP in our office Please call [facility] at [phone] to set up an appointment to see your surgeon in the office for a follow-up appointment approximately 2-3 weeks after your surgery. Make sure that you call for this appointment the day you arrive home to insure a convenient appointment time.  9.  If you have disability of FMLA / Family leave forms, please bring the forms to the office for processing.  (do not give to your surgeon).  WHEN TO CALL US [phone]: Poor pain control Reactions / problems with new medications (rash/itching, nausea, etc)  Fever over 101.5 F (38.5 C) Inability to urinate Nausea and/or vomiting Worsening swelling or bruising Continued bleeding from incision. Increased pain, redness, or drainage from the incision   The clinic staff is available to answer your  questions during regular business hours (8:30am-5pm).  Please don't hesitate to call and ask to speak to one of our nurses for clinical concerns.   If you have a medical emergency, go to the nearest emergency room or call 911.  A surgeon from [facility] is always on call at the hospitals in [place]  [facility] [address], [address], [place], [place]  [address] ?  [address] [address], [place], [place]   [address] MAIN: [phone] ? TOLL FREE: [phone] ? FAX: [phone] [email or web address]  

## 2024-01-18 NOTE — Op Note (Signed)
 Operative Note  Evan Baker  [id number]  [id number]  01/18/2024   Surgeon: Mitzie DELENA Freund MD FACS   Procedure performed: Open left inguinal hernia repair with mesh   Preop diagnosis:  left inguinal hernia   Post-op diagnosis/intraop findings: large direct and small indirect left inguinal hernia   Specimens: none   EBL: 5cc   Complications: none   Description of procedure: After confirming informed consent, the patient was taken to the operating room and placed supine on operating room table where general anesthesia was initiated, preoperative antibiotics were administered, SCDs applied, and a formal timeout was performed. The groin was clipped, prepped and draped in the usual sterile fashion. An oblique incision was made the just above the inguinal ligament after infiltrating the tissues with local anesthetic (Exparel  mixed with quarter percent Marcaine  with epinephrine ). Soft tissues were dissected using electrocautery until the external oblique aponeurosis was encountered. This was divided sharply to expand the external ring. A plane was bluntly developed between the spermatic cord and the external oblique. The ilioinguinal nerve was divided between hemostats and ligated with 3-0 Vicryl ties. The spermatic cord was then bluntly dissected away from the pubic tubercle and encircled with a Penrose. Inspection of the inguinal anatomy revealed a small indirect hernia sac and a large direct hernia with disruption of the inguinal floor. The indirect hernia sac was bluntly dissected away from the cord structures and skeletonized to the level of the internal ring, where it was reduced intact into the abdomen.  With the direct hernia reduced, the inguinal floor was reconstructed suturing the conjoint tendon to the inguinal ligament with interrupted 0 PDS, leaving an internal ring just sufficient for the cord structures. A 3 x 6 piece of ultra Pro mesh was brought onto the field and trimmed to  approximate the field. This was sutured to the pubic tubercle fascia, inferior shelving edge and to the internal oblique superiorly with interrupted 0 ethibonds. The tails of the mesh were wrapped around the spermatic cord, ensuring adequate room for the cord, and sutured to each other with 0 ethibond, and then directed laterally to lie flat beneath the external oblique aponeurosis.  An additional Ethibond was placed medially to reinforce the slit in the mesh.  Hemostasis was ensured within the wound. The Penrose was removed. The external oblique aponeurosis was reapproximated with a running 3-0 Vicryl to re-create a narrowed external ring. More local was infiltrated around the pubic tubercle and in the plane just below the external oblique. The Scarpa's was reapproximated with interrupted 3-0 Vicryls. The skin was closed with a running subcuticular 4-0 Monocryl. The remainder of the local was injected in the subcutaneous and subcuticular space. The field was then cleaned, benzoin and Steri-Strips and sterile bandage were applied. The patient was then awakened extubated and taken to PACU in stable condition.    All counts were correct at the completion of the case

## 2024-01-18 NOTE — Transfer of Care (Addendum)
 Immediate Anesthesia Transfer of Care Note  Patient: Evan Baker  Procedure(s) Performed: REPAIR, HERNIA, INGUINAL, ADULT (Left: Inguinal)  Patient Location: PACU  Anesthesia Type:General  Level of Consciousness: drowsy and patient cooperative  Airway & Oxygen Therapy: Patient Spontanous Breathing and Patient connected to face mask oxygen  Post-op Assessment: Report given to RN and Post -op Vital signs reviewed and stable  Post vital signs: Reviewed and stable  Last Vitals:  Vitals Value Taken Time  BP 126/71 01/18/24 11:22  Temp 36.4 01/18/24   11:22  Pulse 61 01/18/24 11:25  Resp 12 01/18/24 11:25  SpO2 100 % 01/18/24 11:25  Vitals shown include unfiled device data.  Last Pain:  Vitals:   01/18/24 0836  TempSrc: Oral         Complications: No notable events documented.

## 2024-01-19 ENCOUNTER — Encounter (HOSPITAL_COMMUNITY): Payer: Self-pay | Admitting: Surgery
